# Patient Record
Sex: Female | Born: 2000
Health system: Southern US, Community
[De-identification: ages and names within clinical notes are randomized; demographics above are authoritative.]

## PROBLEM LIST (undated history)

## (undated) DIAGNOSIS — T7840XA Allergy, unspecified, initial encounter: Secondary | ICD-10-CM

## (undated) HISTORY — DX: Allergy, unspecified, initial encounter: T78.40XA

## (undated) HISTORY — PX: INGUINAL HERNIA REPAIR: SUR1180

## (undated) HISTORY — PX: HAND SURGERY: SHX662

---
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
## 2018-02-04 ENCOUNTER — Ambulatory Visit (HOSPITAL_COMMUNITY)
Admission: EM | Admit: 2018-02-04 | Discharge: 2018-02-04 | Disposition: A | Discharge status: Home / Self Care | Payer: 59 | Attending: Family Medicine | Admitting: Family Medicine

## 2018-02-04 ENCOUNTER — Ambulatory Visit (INDEPENDENT_AMBULATORY_CARE_PROVIDER_SITE_OTHER): Payer: 59

## 2018-02-04 ENCOUNTER — Encounter (HOSPITAL_COMMUNITY): Payer: Self-pay

## 2018-02-04 DIAGNOSIS — J189 Pneumonia, unspecified organism: Secondary | ICD-10-CM

## 2018-02-04 DIAGNOSIS — J181 Lobar pneumonia, unspecified organism: Secondary | ICD-10-CM | POA: Diagnosis not present

## 2018-02-04 MED ORDER — IBUPROFEN 600 MG PO TABS: 600.0000 mg | ORAL_TABLET | Freq: Four times a day (QID) | ORAL | 0 refills | Status: DC | PRN

## 2018-02-04 MED ORDER — AZITHROMYCIN 250 MG PO TABS: 250.0000 mg | ORAL_TABLET | Freq: Every day | ORAL | 0 refills | Status: AC

## 2018-02-04 MED ORDER — PSEUDOEPH-BROMPHEN-DM 30-2-10 MG/5ML PO SYRP: 5.0000 mL | ORAL_SOLUTION | Freq: Four times a day (QID) | ORAL | 0 refills | Status: DC | PRN

## 2018-02-04 MED ORDER — ACETAMINOPHEN 325 MG PO TABS
650.0000 mg | ORAL_TABLET | Freq: Once | ORAL | Status: AC
  Administered 2018-02-04: 650 mg via ORAL

## 2018-02-04 MED ORDER — ACETAMINOPHEN 325 MG PO TABS
ORAL_TABLET | ORAL | Status: AC
  Filled 2018-02-04: qty 2

## 2018-02-04 NOTE — Discharge Instructions (Addendum)
Pneumonia on Xray Please begin azithromycin- 2 tabs today, 1 tab for the following 4 days May use cough syrup provided as needed ever 6-8 hours Alternate ibuprofen and tylenol every 4 hours to help with fever  Follow up if fever not breaking, shortness of breath and chest discomfort not improving

## 2018-02-04 NOTE — ED Triage Notes (Signed)
Pt presents with deep non productive persistent cough, shortness of breath and fever.

## 2018-02-04 NOTE — ED Provider Notes (Signed)
[facility]    CSN: [id number] Arrival date & time: 02/04/18  1407     History   Chief Complaint Chief Complaint  Patient presents with  . Cough  . Fever  . Shortness of Breath    HPI Carla Wang is a 17 y.o. female history of eczema, presenting today for evaluation of cough and fever.  Patient has had cough and fever for the past week.  She was seen by her PCP on Friday and treated for influenza-like illness with symptomatic care.  Her cough and fever have persisted.  She went to her primary care earlier today and was recommended to have chest x-ray to rule out pneumonia.  She has had minimal sore throat and congestion/rhinorrhea.  She has had some shortness of breath and chest discomfort.  Cough is been nonproductive.  Has tried Alka-Seltzer cough and cold without relief.  Has also been prescribed Promethazine DM without relief.   HPI  History reviewed. No pertinent past medical history.  Patient Active Problem List   Diagnosis Date Noted  . Acne vulgaris 10/21/2017  . Other seborrheic dermatitis 10/21/2017  . Eczema 10/21/2017  . Chronic pain of right knee 03/14/2017  . Dry skin dermatitis 03/14/2017  . Dysmenorrhea 10/20/2015  . Scoliosis 09/08/2014    Past Surgical History:  Procedure Laterality Date  . HAND SURGERY    . INGUINAL HERNIA REPAIR      OB History   None      Home Medications    Prior to Admission medications   Medication Sig Start Date End Date Taking? Authorizing Provider  azithromycin (ZITHROMAX) 250 MG tablet Take 1 tablet (250 mg total) by mouth daily for 5 days. Take first 2 tablets together, then 1 every day until finished. 02/04/18 02/09/18  Matisha Termine C, PA-C  brompheniramine-pseudoephedrine-DM 30-2-10 MG/5ML syrup Take 5 mLs by mouth 4 (four) times daily as needed. 02/04/18   Kayle Passarelli C, PA-C  ibuprofen (ADVIL,MOTRIN) 600 MG tablet Take 1 tablet (600 mg total) by mouth every 6 (six) hours as needed. 02/04/18    Nishawn Rotan C, PA-C  norethindrone-ethinyl estradiol-iron (ESTROSTEP FE,TILIA FE,TRI-LEGEST FE) 1-20/1-30/1-35 MG-MCG tablet Take 1 tablet by mouth daily.    [provider]  tacrolimus (PROTOPIC) 0.03 % ointment Apply topically 2 (two) times daily. 03/14/17   Briscoe Deutscher, DO    Family History History reviewed. No pertinent family history.  Social History Social History   Tobacco Use  . Smoking status: Never Smoker  . Smokeless tobacco: Never Used  Substance Use Topics  . Alcohol use: No  . Drug use: No     Allergies   Patient has no known allergies.   Review of Systems Review of Systems  Constitutional: Positive for appetite change and fever. Negative for activity change, chills and fatigue.  HENT: Negative for congestion, ear pain, rhinorrhea, sinus pressure, sore throat and trouble swallowing.   Eyes: Negative for discharge and redness.  Respiratory: Positive for cough and shortness of breath. Negative for chest tightness.   Cardiovascular: Negative for chest pain.  Gastrointestinal: Negative for abdominal pain, diarrhea, nausea and vomiting.  Musculoskeletal: Negative for myalgias.  Skin: Negative for rash.  Neurological: Negative for dizziness, light-headedness and headaches.     Physical Exam Triage Vital Signs ED Triage Vitals  Enc Vitals Group     BP 02/04/18 1504 120/80     Pulse Rate 02/04/18 1504 (!) 125     Resp 02/04/18 1504 20  Temp 02/04/18 1504 (!) 102.2 F (39 C)     Temp Source 02/04/18 1504 Oral     SpO2 02/04/18 1504 100 %     Weight --      Height --      Head Circumference --      Peak Flow --      Pain Score 02/04/18 1505 6     Pain Loc --      Pain Edu? --      Excl. in [place]? --    No data found.  Updated Vital Signs BP 120/80 (BP Location: Right Arm)   Pulse (!) 125   Temp (!) 102.2 F (39 C) (Oral)   Resp 20   LMP 01/16/2018   SpO2 100%   Visual Acuity Right Eye Distance:   Left Eye Distance:   Bilateral  Distance:    Right Eye Near:   Left Eye Near:    Bilateral Near:     Physical Exam  Constitutional: She appears well-developed and well-nourished. No distress.  HENT:  Head: Normocephalic and atraumatic.  Bilateral ears without tenderness to palpation of external auricle, tragus and mastoid, EAC's without erythema or swelling, TM's with good bony landmarks and cone of light. Non erythematous.  Oral mucosa pink and moist, no tonsillar enlargement or exudate. Posterior pharynx patent and nonerythematous, no uvula deviation or swelling. Normal phonation.  Eyes: Conjunctivae are normal.  Neck: Neck supple.  Cardiovascular: Normal rate and regular rhythm.  No murmur heard. Pulmonary/Chest: Effort normal. No respiratory distress.  Breathing comfortably at rest, slight decreased breath sounds on the left side of chest  Abdominal: Soft. There is no tenderness.  Musculoskeletal: She exhibits no edema.  Neurological: She is alert.  Skin: Skin is warm and dry.  Psychiatric: She has a normal mood and affect.  Nursing note and vitals reviewed.    UC Treatments / Results  Labs (all labs ordered are listed, but only abnormal results are displayed) Labs Reviewed - No data to display  EKG None  Radiology Dg Chest 2 View  Result Date: 02/04/2018 CLINICAL DATA:  Shortness of breath.  Fever. EXAM: CHEST - 2 VIEW COMPARISON:  August 29, 2014 FINDINGS: There is an infiltrate in the superior segment of the left lower lobe. The heart, hila, mediastinum, and remainder of the lungs are normal. No pneumothorax. IMPRESSION: Infiltrate, most consistent with pneumonia, in the superior segment of the left lower lobe. Recommend follow-up to resolution. Electronically Signed   By: Dorise Bullion III M.D   On: 02/04/2018 15:59    Procedures Procedures (including critical care time)  Medications Ordered in UC Medications  acetaminophen (TYLENOL) tablet 650 mg (650 mg Oral Given 02/04/18 1511)    Initial  Impression / Assessment and Plan / UC Course  I have reviewed the triage vital signs and the nursing notes.  Pertinent labs & imaging results that were available during my care of the patient were reviewed by me and considered in my medical decision making (see chart for details).     Left lower lobe pneumonia, will treat with azithromycin.  Cough syrup as needed.  Tylenol and ibuprofen to control fever.  Push fluids.  May try honey as well.Discussed strict return precautions. Patient verbalized understanding and is agreeable with plan.  Final Clinical Impressions(s) / UC Diagnoses   Final diagnoses:  Community acquired pneumonia of left lower lobe of lung [facility])     Discharge Instructions     Pneumonia on Xray Please  begin azithromycin- 2 tabs today, 1 tab for the following 4 days May use cough syrup provided as needed ever 6-8 hours Alternate ibuprofen and tylenol every 4 hours to help with fever  Follow up if fever not breaking, shortness of breath and chest discomfort not improving   ED Prescriptions    Medication Sig Dispense Auth. Provider   azithromycin (ZITHROMAX) 250 MG tablet Take 1 tablet (250 mg total) by mouth daily for 5 days. Take first 2 tablets together, then 1 every day until finished. 6 tablet Quadarius Henton C, PA-C   brompheniramine-pseudoephedrine-DM 30-2-10 MG/5ML syrup Take 5 mLs by mouth 4 (four) times daily as needed. 120 mL Gayla Benn C, PA-C   ibuprofen (ADVIL,MOTRIN) 600 MG tablet Take 1 tablet (600 mg total) by mouth every 6 (six) hours as needed. 30 tablet Dannell Gortney, [place] C, PA-C     Controlled Substance Prescriptions [place] Controlled Substance Registry consulted? Not Applicable   Janith Lima, [place] 02/04/18 1627

## 2018-12-01 ENCOUNTER — Encounter: Payer: Self-pay | Admitting: Family Medicine

## 2018-12-01 ENCOUNTER — Other Ambulatory Visit: Payer: Self-pay

## 2018-12-01 ENCOUNTER — Ambulatory Visit (INDEPENDENT_AMBULATORY_CARE_PROVIDER_SITE_OTHER): Payer: 59 | Admitting: Family Medicine

## 2018-12-01 VITALS — BP 110/80 | HR 113 | Temp 97.7°F | Ht 65.0 in | Wt 140.0 lb

## 2018-12-01 DIAGNOSIS — J302 Other seasonal allergic rhinitis: Secondary | ICD-10-CM | POA: Diagnosis not present

## 2018-12-01 DIAGNOSIS — H6981 Other specified disorders of Eustachian tube, right ear: Secondary | ICD-10-CM

## 2018-12-01 DIAGNOSIS — H6991 Unspecified Eustachian tube disorder, right ear: Secondary | ICD-10-CM

## 2018-12-01 DIAGNOSIS — Z23 Encounter for immunization: Secondary | ICD-10-CM

## 2018-12-01 DIAGNOSIS — M7050 Other bursitis of knee, unspecified knee: Secondary | ICD-10-CM

## 2018-12-01 DIAGNOSIS — L853 Xerosis cutis: Secondary | ICD-10-CM

## 2018-12-01 MED ORDER — FLUTICASONE PROPIONATE 50 MCG/ACT NA SUSP: 2.0000 | Freq: Every day | NASAL | 6 refills | Status: DC

## 2018-12-01 MED ORDER — MONTELUKAST SODIUM 10 MG PO TABS: 10.0000 mg | ORAL_TABLET | Freq: Every day | ORAL | 3 refills | Status: DC

## 2018-12-01 NOTE — Progress Notes (Signed)
   Carla Wang is a 18 y.o. female here for an acute visit.  History of Present Illness:   HPI: Right knee pain.  Gradual onset.  Started about a year ago.  Intermittent.  No trauma.  Pain is medial knee, worse with flexion.  No redness or swelling.  Uses anti-inflammatories without much relief.  Right ear pain.  Ongoing intermittently.  Pain around jawline sometimes.  History of seasonal allergies.  Taking antihistamines over-the-counter only.  PMHx, SurgHx, SocialHx, Medications, and Allergies were reviewed in the Visit Navigator and updated as appropriate.  Current Medications   .  norethindrone-ethinyl estradiol-iron (ESTROSTEP FE,TILIA FE,TRI-LEGEST FE) 1-20/1-30/1-35 MG-MCG tablet, Take 1 tablet by mouth daily., Disp: , Rfl:   No Known Allergies   Review of Systems   Pertinent items are noted in the HPI. Otherwise, ROS is negative.  Vitals   Vitals:   12/01/18 0912  BP: 110/80  Pulse: (!) 113  Temp: 97.7 F (36.5 C)  TempSrc: Temporal  SpO2: 96%  Weight: 140 lb (63.5 kg)  Height: 5\' 5"  (1.651 m)     Body mass index is 23.3 kg/m.  Physical Exam   Physical Exam Vitals signs and nursing note reviewed.  HENT:     Head: Normocephalic and atraumatic.     Right Ear: Tympanic membrane and external ear normal.     Left Ear: Tympanic membrane and external ear normal.     Nose: No congestion or rhinorrhea.  Eyes:     Pupils: Pupils are equal, round, and reactive to light.  Neck:     Musculoskeletal: Normal range of motion and neck supple.  Cardiovascular:     Rate and Rhythm: Normal rate and regular rhythm.     Heart sounds: Normal heart sounds.  Pulmonary:     Effort: Pulmonary effort is normal.  Abdominal:     Palpations: Abdomen is soft.  Musculoskeletal:     Right knee: She exhibits decreased range of motion.       Legs:  Skin:    General: Skin is warm.  Neurological:     Mental Status: She is alert.  Psychiatric:        Behavior: Behavior  normal.    Assessment and Plan   [place] was seen today for otalgia.  Diagnoses and all orders for this visit:  Pes anserine bursitis, right knee -     AMB referral to sports medicine  Dry skin dermatitis  Dysfunction of right eustachian tube -     fluticasone (FLONASE) 50 MCG/ACT nasal spray; Place 2 sprays into both nostrils daily.  Seasonal allergies -     montelukast (SINGULAIR) 10 MG tablet; Take 1 tablet (10 mg total) by mouth at bedtime. -     fluticasone (FLONASE) 50 MCG/ACT nasal spray; Place 2 sprays into both nostrils daily.  Need for immunization against influenza -     Flu Vaccine QUAD 36+ mos IM   . Reviewed expectations re: course of current medical issues. . Discussed self-management of symptoms. . Outlined signs and symptoms indicating need for more acute intervention. . Patient verbalized understanding and all questions were answered. Marland Kitchen Health Maintenance issues including appropriate healthy diet, exercise, and smoking avoidance were discussed with patient. . See orders for this visit as documented in the electronic medical record. . Patient received an After Visit Summary.  Briscoe Deutscher, DO [place], [facility] 12/01/2018

## 2018-12-01 NOTE — Patient Instructions (Signed)
Eustachian Tube Dysfunction  Eustachian tube dysfunction refers to a condition in which a blockage develops in the narrow passage that connects the middle ear to the back of the nose (eustachian tube). The eustachian tube regulates air pressure in the middle ear by letting air move between the ear and nose. It also helps to drain fluid from the middle ear space. Eustachian tube dysfunction can affect one or both ears. When the eustachian tube does not function properly, air pressure, fluid, or both can build up in the middle ear. What are the causes? This condition occurs when the eustachian tube becomes blocked or cannot open normally. Common causes of this condition include:  Ear infections.  Colds and other infections that affect the nose, mouth, and throat (upper respiratory tract).  Allergies.  Irritation from cigarette smoke.  Irritation from stomach acid coming up into the esophagus (gastroesophageal reflux). The esophagus is the tube that carries food from the mouth to the stomach.  Sudden changes in air pressure, such as from descending in an airplane or scuba diving.  Abnormal growths in the nose or throat, such as: ? Growths that line the nose (nasal polyps). ? Abnormal growth of cells (tumors). ? Enlarged tissue at the back of the throat (adenoids). What increases the risk? You are more likely to develop this condition if:  You smoke.  You are overweight.  You are a child who has: ? Certain birth defects of the mouth, such as cleft palate. ? Large tonsils or adenoids. What are the signs or symptoms? Common symptoms of this condition include:  A feeling of fullness in the ear.  Ear pain.  Clicking or popping noises in the ear.  Ringing in the ear.  Hearing loss.  Loss of balance.  Dizziness. Symptoms may get worse when the air pressure around you changes, such as when you travel to an area of high elevation, fly on an airplane, or go scuba diving. How is  this diagnosed? This condition may be diagnosed based on:  Your symptoms.  A physical exam of your ears, nose, and throat.  Tests, such as those that measure: ? The movement of your eardrum (tympanogram). ? Your hearing (audiometry). How is this treated? Treatment depends on the cause and severity of your condition.  In mild cases, you may relieve your symptoms by moving air into your ears. This is called "popping the ears."  In more severe cases, or if you have symptoms of fluid in your ears, treatment may include: ? Medicines to relieve congestion (decongestants). ? Medicines that treat allergies (antihistamines). ? Nasal sprays or ear drops that contain medicines that reduce swelling (steroids). ? A procedure to drain the fluid in your eardrum (myringotomy). In this procedure, a small tube is placed in the eardrum to:  Drain the fluid.  Restore the air in the middle ear space. ? A procedure to insert a balloon device through the nose to inflate the opening of the eustachian tube (balloon dilation). Follow these instructions at home: Lifestyle  Do not do any of the following until your health care provider approves: ? Travel to high altitudes. ? Fly in airplanes. ? Work in a pressurized cabin or room. ? Scuba dive.  Do not use any products that contain nicotine or tobacco, such as cigarettes and e-cigarettes. If you need help quitting, ask your health care provider.  Keep your ears dry. Wear fitted earplugs during showering and bathing. Dry your ears completely after. General instructions  Take over-the-counter   and prescription medicines only as told by your health care provider.  Use techniques to help pop your ears as recommended by your health care provider. These may include: ? Chewing gum. ? Yawning. ? Frequent, forceful swallowing. ? Closing your mouth, holding your nose closed, and gently blowing as if you are trying to blow air out of your nose.  Keep all  follow-up visits as told by your health care provider. This is important. Contact a health care provider if:  Your symptoms do not go away after treatment.  Your symptoms come back after treatment.  You are unable to pop your ears.  You have: ? A fever. ? Pain in your ear. ? Pain in your head or neck. ? Fluid draining from your ear.  Your hearing suddenly changes.  You become very dizzy.  You lose your balance. Summary  Eustachian tube dysfunction refers to a condition in which a blockage develops in the eustachian tube.  It can be caused by ear infections, allergies, inhaled irritants, or abnormal growths in the nose or throat.  Symptoms include ear pain, hearing loss, or ringing in the ears.  Mild cases are treated with maneuvers to unblock the ears, such as yawning or ear popping.  Severe cases are treated with medicines. Surgery may also be done (rare). This information is not intended to replace advice given to you by your health care provider. Make sure you discuss any questions you have with your health care provider. Document Released: 03/17/2015 Document Revised: 06/10/2017 Document Reviewed: 06/10/2017 Elsevier Patient Education  [employer].  Pes Anserine Bursitis Rehab Ask your health care provider which exercises are safe for you. Do exercises exactly as told by your health care provider and adjust them as directed. It is normal to feel mild stretching, pulling, tightness, or discomfort as you do these exercises. Stop right away if you feel sudden pain or your pain gets worse. Do not begin these exercises until told by your health care provider. Stretching and range-of-motion exercises These exercises warm up your muscles and joints and improve the movement and flexibility of your knee. These exercises also help to relieve pain and stiffness. Hamstring stretch, doorway  1. Lie on your back in front of a doorway with your left / right leg resting against  the wall and your other leg flat on the floor in the doorway. There should be a slight bend in your left / right knee. 2. Straighten your left / right knee. You should feel a stretch behind your knee or thigh (hamstring). If you do not, scoot your buttocks closer to the door. 3. Hold this position for __________ seconds. Repeat __________ times. Complete this exercise __________ times a day. Seated stretch This exercise is sometimes called hamstrings and adductors stretch. 1. Sit on the floor with your legs stretched wide. Keep your knees straight during this exercise. 2. Keeping your head and back in a straight line, bend at your waist to reach for your left foot (position A). You should feel a stretch in your right inner thigh (adductors). 3. Hold for __________ seconds. Then slowly return to the upright position. 4. Keeping your head and back in a straight line, bend at your waist to reach forward (position B). You should feel a stretch behind both of your thighs or knees (hamstrings). 5. Hold for __________ seconds. Then slowly return to the upright position. 6. Keeping your head and back in a straight line, bend at your waist to reach for  your right foot (position C). You should feel a stretch in your left inner thigh (adductors). 7. Hold for __________ seconds. Then slowly return to the upright position. Repeat __________ times. Complete this exercise __________ times a day. Quadriceps, prone  1. Lie on your abdomen on a firm surface, such as a bed or padded floor (prone position). 2. Bend your left / right knee and hold your ankle. If you cannot reach your ankle or pant leg, loop a belt around your foot and grab the belt instead. 3. Gently pull your heel toward your buttocks. Your knee should not slide out to the side. You should feel a stretch in the front of your thigh and knee (quadriceps). 4. Hold this position for __________ seconds. Repeat __________ times. Complete this exercise  __________ times a day. Strengthening exercises These exercises build strength and endurance in your knee and hip. Endurance is the ability to use your muscles for a long time, even after they get tired. Quadriceps terminal knee extension 1. Secure a long loop of rubber exercise band around a sturdy object like a table leg. 2. Put the band behind your left / right knee. Step back from where the band is secured to put tension on the band. 3. Slowly bend your left / right knee. Keep your left / right foot flat on the floor. 4. Tighten the muscle in the front of your thigh (quadriceps) and push back against the band to straighten your knee until it is completely straight (terminal extension). 5. Hold this position for __________ seconds. 6. Return to having your left / right knee bent. Repeat __________ times. Complete this exercise __________ times a day. Straight leg raises, side-lying This exercise strengthens the muscles that rotate the leg at the hip and move it away from your body (hip abductors). 1. Lie on your side with your left / right leg in the top position. Lie so your head, shoulder, hip, and knee line up. You may bend your bottom knee to help you balance. 2. Lift your top leg 4-6 inches (10-15 cm) while keeping your toes pointed straight ahead. 3. Hold this position for __________ seconds. 4. Slowly lower your leg to the starting position. 5. Allow your muscles to relax completely after each repetition. Repeat __________ times. Complete this exercise __________ times a day. This information is not intended to replace advice given to you by your health care provider. Make sure you discuss any questions you have with your health care provider. Document Released: 02/18/2005 Document Revised: 06/11/2018 Document Reviewed: 12/11/2017 Elsevier Patient Education  2020 [employer].

## 2018-12-07 ENCOUNTER — Ambulatory Visit (INDEPENDENT_AMBULATORY_CARE_PROVIDER_SITE_OTHER): Payer: 59 | Admitting: Family Medicine

## 2018-12-07 ENCOUNTER — Encounter: Payer: Self-pay | Admitting: Family Medicine

## 2018-12-07 DIAGNOSIS — M6751 Plica syndrome, right knee: Secondary | ICD-10-CM | POA: Diagnosis not present

## 2018-12-07 NOTE — Progress Notes (Signed)
I saw and examined the patient with Dr. Mayer Masker and agree with assessment and plan as outlined.    Chronic medial right knee pain for about a year.  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  She hyperpronates, possibly contributing to her pain.  Also has some quad weakness.  Discussed options and she would like to try an injection today, followed by home strengthening exercises.  Suggested shoes with good arch supports as well.  Could refer her to PT if pain persists.

## 2018-12-07 NOTE — Progress Notes (Signed)
Carla Wang - 18 y.o. female MRN [id number]  Date of birth: Oct 24, 2000  Office Visit Note: Visit Date: 12/07/2018 PCP: Briscoe Deutscher, DO Referred by: Briscoe Deutscher, DO  Subjective: Chief Complaint  Patient presents with  . Right Knee - Pain    Pain off & on medial aspect of knee. Referred by Dr. Parke Simmers for pes bursitis.   HPI: Carla Wang is a 18 y.o. female who comes in today with intermittent right medial knee pain for the past year. She reports that pain is worse with prolonged walking. Knee swells intermittently, improves with icing and iburprofen. Denies inciting injury. Her right knee also bothered her 3 years ago when she was taking weight lifting class in high school.  Denies symptoms of catching, locking, or giving out.   ROS Otherwise per HPI.  Assessment & Plan: Visit Diagnoses:  1. Plica syndrome of knee, right    Pain with palpation of infrapatellar medial plica, no effusion on ultrasound.  Plan:  - corticosteroid injection of medial infrapatellar plica - demonstrated isometric quadriceps strengthening exercises to do daily  - recommended shoes with insole support   Meds & Orders: No orders of the defined types were placed in this encounter.  No orders of the defined types were placed in this encounter.   Follow-up: PRN  Procedures: Right knee corticosteroid injection: Medial right knee infrapatellar plica was palpated, Overlying skin was marked, prepped in a sterile fashion with iodine and alcohol swabs. Ethyl chloride was used as topical analgesic spray and plica was injected with 4 cc lidocaine and 40 mg methylpred.   Clinical History: No specialty comments available.   She reports that she has never smoked. She has never used smokeless tobacco. No results for input(s): HGBA1C, LABURIC in the last 8760 hours.  Objective:  VS:  HT:    WT:   BMI:     BP:   HR: bpm  TEMP: ( )  RESP:  Physical Exam  PHYSICAL EXAM: Gen: NAD, alert,  cooperative with exam, well-appearing HEENT: clear conjunctiva,  CV:  no edema, capillary refill brisk, normal rate Resp: non-labored Skin: no rashes, normal turgor  Neuro: no gross deficits.  Psych:  alert and oriented  Ortho Exam  Right Knee: - Inspection: no gross deformity. No swelling/effusion, erythema or bruising. Skin intact - Palpation: TTP along medial knee, palpable cord medial to patella and infarpatellar, TTP over infrapatellar medial fat pad   - ROM: full active ROM with flexion and extension in knee and hip - Strength: 5/5 strength - Neuro/vasc: NV intact - Special Tests: - LIGAMENTS: negative anterior and posterior drawer, negative Lachman's, no MCL or LCL laxity  -- MENISCUS: negative McMurray's, negative Thessaly  -- PF JOINT: nml patellar mobility bilaterally.  negative patellar grind  Hips: normal ROM, negative FABER and FADIR bilaterally  Feet: pes planus bilaterally, ankle pronation with standing   Imaging: No results found.  Past Medical/Family/Surgical/Social History: Medications & Allergies reviewed per EMR, new medications updated. Patient Active Problem List   Diagnosis Date Noted  . Acne vulgaris 10/21/2017  . Other seborrheic dermatitis 10/21/2017  . Eczema 10/21/2017  . Chronic pain of right knee 03/14/2017  . Dry skin dermatitis 03/14/2017  . Dysmenorrhea 10/20/2015  . Scoliosis 09/08/2014   History reviewed. No pertinent past medical history. History reviewed. No pertinent family history. Past Surgical History:  Procedure Laterality Date  . HAND SURGERY    . INGUINAL HERNIA REPAIR     Social History  Occupational History  . Occupation: Ship broker  Tobacco Use  . Smoking status: Never Smoker  . Smokeless tobacco: Never Used  Substance and Sexual Activity  . Alcohol use: No  . Drug use: No  . Sexual activity: Not on file

## 2019-03-15 ENCOUNTER — Other Ambulatory Visit: Payer: 59

## 2019-04-20 ENCOUNTER — Ambulatory Visit (INDEPENDENT_AMBULATORY_CARE_PROVIDER_SITE_OTHER): Payer: 59 | Admitting: Family Medicine

## 2019-04-20 ENCOUNTER — Ambulatory Visit: Payer: Self-pay | Admitting: Family Medicine

## 2019-04-20 ENCOUNTER — Other Ambulatory Visit: Payer: Self-pay

## 2019-04-20 ENCOUNTER — Encounter: Payer: Self-pay | Admitting: Family Medicine

## 2019-04-20 VITALS — Ht 65.0 in | Wt 140.0 lb

## 2019-04-20 DIAGNOSIS — J309 Allergic rhinitis, unspecified: Secondary | ICD-10-CM

## 2019-04-20 MED ORDER — FEXOFENADINE HCL 180 MG PO TABS: 180.0000 mg | ORAL_TABLET | Freq: Every day | ORAL | Status: DC

## 2019-04-20 NOTE — Progress Notes (Signed)
Virtual Visit via Video Note  Subjective  CC:  Chief Complaint  Patient presents with  . Ear Pain    started last week. Right ear pain  . throat irritation    itching of the throat. no fever     I connected with [place] on 04/20/19 at  4:20 PM EST by a video enabled telemedicine application and verified that I am speaking with the correct person using two identifiers. Location patient: Home Location provider: [facility] [place], Office Persons participating in the virtual visit: [place], Leamon Arnt, MD Serita Sheller, [place]  Same day acute visit; PCP not available. New pt to me. Chart reviewed.   I discussed the limitations of evaluation and management by telemedicine and the availability of in person appointments. The patient expressed understanding and agreed to proceed. HPI: Carla Wang is a 19 y.o. female who was contacted today to address the problems listed above in the chief complaint. . 20 yo with eczema, allergies c/o worsening allergy sxs. [facility] (I reviewed [facility]) for ear fullness and treated with benadryl. Uses flonase and singulair but complains of pnd,itchy throat and ears. No sinus pain,f/c/s, cough, malaise. Uses allegra on occasion (only when it is really bad). Has never seen an allergist. Has been treated for secondary otitis media in past.   Assessment  1. Chronic allergic rhinitis      Plan   AR:  Start allegra daily. Monitor for improvement over next few weeks. May add sudafed prn if needed. If not improving, would consider stepping up nasal steroid and /or allergy referral.   rec pt make a toc appt since prior pcp is no longer here.  I discussed the assessment and treatment plan with the patient. The patient was provided an opportunity to ask questions and all were answered. The patient agreed with the plan and demonstrated an understanding of the instructions.   The patient was advised  to call back or seek an in-person evaluation if the symptoms worsen or if the condition fails to improve as anticipated. Follow up: toc appt  Visit date not found  Meds ordered this encounter  Medications  . fexofenadine (ALLEGRA ALLERGY) 180 MG tablet    Sig: Take 1 tablet (180 mg total) by mouth daily.    Dispense:         I reviewed the patients updated PMH, FH, and SocHx.    Patient Active Problem List   Diagnosis Date Noted  . Acne vulgaris 10/21/2017  . Other seborrheic dermatitis 10/21/2017  . Eczema 10/21/2017  . Chronic pain of right knee 03/14/2017  . Dry skin dermatitis 03/14/2017  . Dysmenorrhea 10/20/2015  . Scoliosis 09/08/2014   Current Meds  Medication Sig  . fluticasone (FLONASE) 50 MCG/ACT nasal spray Place 2 sprays into both nostrils daily.  Marland Kitchen ibuprofen (ADVIL,MOTRIN) 600 MG tablet Take 1 tablet (600 mg total) by mouth every 6 (six) hours as needed.  . montelukast (SINGULAIR) 10 MG tablet Take 1 tablet (10 mg total) by mouth at bedtime.  . norethindrone-ethinyl estradiol-iron (ESTROSTEP FE,TILIA FE,TRI-LEGEST FE) 1-20/1-30/1-35 MG-MCG tablet Take 1 tablet by mouth daily.    Allergies: Patient has No Known Allergies. Family History: Patient family history is not on file. Social History:  Patient  reports that she has never smoked. She has never used smokeless tobacco. She reports that she does not drink alcohol or use drugs.  Review of  Systems: Constitutional: Negative for fever malaise or anorexia Cardiovascular: negative for chest pain Respiratory: negative for SOB or persistent cough Gastrointestinal: negative for abdominal pain  OBJECTIVE Vitals: Ht 5\' 5"  (1.651 m)   Wt 140 lb (63.5 kg)   LMP 04/08/2019   BMI 23.30 kg/m  General: no acute distress , A&Ox3 No nasal congestion  Leamon Arnt, MD

## 2019-08-09 ENCOUNTER — Encounter: Payer: Self-pay | Admitting: Family Medicine

## 2019-11-29 ENCOUNTER — Other Ambulatory Visit: Payer: Self-pay

## 2019-11-29 ENCOUNTER — Encounter: Payer: Self-pay | Admitting: Family Medicine

## 2019-11-29 ENCOUNTER — Ambulatory Visit (INDEPENDENT_AMBULATORY_CARE_PROVIDER_SITE_OTHER): Payer: 59 | Admitting: Family Medicine

## 2019-11-29 VITALS — BP 120/82 | HR 72 | Temp 98.0°F | Resp 16 | Ht 65.0 in | Wt 157.0 lb

## 2019-11-29 DIAGNOSIS — Z3041 Encounter for surveillance of contraceptive pills: Secondary | ICD-10-CM | POA: Insufficient documentation

## 2019-11-29 DIAGNOSIS — G8929 Other chronic pain: Secondary | ICD-10-CM

## 2019-11-29 DIAGNOSIS — Z23 Encounter for immunization: Secondary | ICD-10-CM | POA: Diagnosis not present

## 2019-11-29 DIAGNOSIS — K5900 Constipation, unspecified: Secondary | ICD-10-CM

## 2019-11-29 DIAGNOSIS — J309 Allergic rhinitis, unspecified: Secondary | ICD-10-CM | POA: Diagnosis not present

## 2019-11-29 DIAGNOSIS — M41129 Adolescent idiopathic scoliosis, site unspecified: Secondary | ICD-10-CM

## 2019-11-29 DIAGNOSIS — H6981 Other specified disorders of Eustachian tube, right ear: Secondary | ICD-10-CM | POA: Diagnosis not present

## 2019-11-29 DIAGNOSIS — H9201 Otalgia, right ear: Secondary | ICD-10-CM | POA: Insufficient documentation

## 2019-11-29 DIAGNOSIS — N946 Dysmenorrhea, unspecified: Secondary | ICD-10-CM

## 2019-11-29 MED ORDER — FLUTICASONE PROPIONATE 50 MCG/ACT NA SUSP: 2.0000 | Freq: Every day | NASAL | 6 refills | Status: DC

## 2019-11-29 NOTE — Progress Notes (Signed)
Subjective  CC:  Chief Complaint  Patient presents with  . Transitions Of Care  . Ear Pain    right ear   . Constipation    BM twice weekly    HPI: Carla Wang is a 19 y.o. female who presents to [facility] today to establish care with me as a new patient.   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  Lives with her parents and her maternal grandmother.  Is currently in a relationship.  Has been sexually active.  Recently saw GYN for female wellness exam.  Reports normal Pap smear done at the time.  Reports negative HIV test.  Overall she is happy and healthy.  She is a non-smoker.  Drinks occasionally.  No drug use.  Chronic right ear pain and history of recurrent otitis media: She has been to several different urgent cares over the last several years due to this problem.  Diagnosed with eustachian tube dysfunction, allergies, eczema and infections.  However she has almost constant right ear pain.  Feels full or like it needs to drain.  Occasionally she will have sharp pain.  Sometimes she will have itching in the ear canal.  She currently complains of pain.  She is on Advertising account planner.  Denies drainage, trauma, hearing loss.  Chronic allergies manages with over-the-counter medicine and Flonase.  History of dysmenorrhea as an adolescent now well managed on oral contraceptives.  Regular cycles.  She has been sexually active.  She reports negative STD testing.  Complains of not using the bathroom regularly.  She tends to move her bowels once or twice weekly.  She denies hard or hard to pass stools.  Denies abdominal pain.  Denies melena.  Occasionally she will feel some bloating symptoms.  No upper GI symptoms.  She admits her diet is typical of a low mood.  High in processed foods high in fatty foods and high in fast foods.  She does not get a lot of fiber, water or vegetables.  She denies  alternating bouts of diarrhea.  She denies pain.  Reviewed records from orthopedics.  She has seen orthopedics in 2016.  She has mild scoliosis.  She currently does not have back pain.  Assessment  1. Chronic right ear pain   2. Chronic allergic rhinitis   3. Oral contraceptive use   4. Dysfunction of right eustachian tube   5. Constipation, unspecified constipation type   6. Need for immunization against influenza   7. Adolescent idiopathic scoliosis, unspecified spinal region   8. Dysmenorrhea      Plan   Chronic right ear pain with history of reported infections: Has failed conservative management.  Refer to ENT for further evaluation.  Patient agrees.  Chronic allergic rhinitis: For now continue Allegra and Flonase.  Continue oral contraceptives.  Recommend condom use when sexually active.  Recommend annual STD testing.  Will request records from GYN.  Constipation: Mild.  Recommend improving diet, fluid intake and consideration of MiraLAX if she has symptoms.  If she moves her bowels twice weekly without pain, bloating or hard to pass stools, no treatment is needed.  Patient understands and agrees with care plan.  Mild scoliosis without symptoms at this time.  Flu shot updated today.  Tdap updated today.  Follow up: 12 months for your complete annual physical exam with blood work. Please come fasting. Orders Placed This Encounter  Procedures  . Flu Vaccine QUAD 36+ mos IM  . Tdap vaccine greater than or equal to 7yo IM   Meds ordered this encounter  Medications  . fluticasone (FLONASE) 50 MCG/ACT nasal spray    Sig: Place 2 sprays into both nostrils daily.    Dispense:  16 g    Refill:  6     Depression screen [facility] 2/9 12/01/2018 02/21/2017 01/08/2016 05/15/2015  Decreased Interest 1 0 0 0  Down, Depressed, Hopeless 0 0 0 0  PHQ - 2 Score 1 0 0 0  Altered sleeping 0 - - -  Tired, decreased energy 1 - - -  Change in appetite 0 - - -  Feeling bad or failure about  yourself  1 - - -  Trouble concentrating 0 - - -  Moving slowly or fidgety/restless 0 - - -  Suicidal thoughts 0 - - -  PHQ-9 Score 3 - - -  Difficult doing work/chores Not difficult at all - - -    We updated and reviewed the patient's past history in detail and it is documented below.  Patient Active Problem List   Diagnosis Date Noted  . Chronic allergic rhinitis 11/29/2019  . Chronic right ear pain 11/29/2019  . Oral contraceptive use 11/29/2019  . Acne vulgaris 10/21/2017    Seen by [facility] assessment: acne, moderate    . Seborrheic dermatitis 10/21/2017    Followed by [facility]    . Eczema 10/21/2017  . Dysmenorrhea 10/20/2015    Started oral contraceptives for management at age 46.  This medical condition is well controlled.    . Scoliosis 09/08/2014    Mild, evaluated by pediatric orthopedics in 2016.  Low risk of progression.  Surveillance only recommended.    Health Maintenance  Topic Date Due  . PAP SMEAR-Modifier  11/29/2019 (Originally 06/03/2019)  . Hepatitis C Screening  11/28/2020 (Originally 2001/02/02)  . PAP-Cervical Cytology Screening  07/03/2022  . TETANUS/TDAP  11/28/2029  . INFLUENZA VACCINE  Completed  . HIV Screening  Completed   Immunization History  Administered Date(s) Administered  . Influenza,inj,Quad PF,6+ Mos 12/01/2018, 11/29/2019  . Tdap 11/29/2019   Current Meds  Medication Sig  . fexofenadine (ALLEGRA ALLERGY) 180 MG tablet Take 1 tablet (180 mg total) by mouth daily.  . fluticasone (FLONASE) 50 MCG/ACT nasal spray Place 2 sprays into both nostrils daily.  Marland Kitchen ibuprofen (ADVIL,MOTRIN) 600 MG tablet Take 1 tablet (600 mg total) by mouth every 6 (six) hours as needed.  . montelukast (SINGULAIR) 10 MG tablet Take 1 tablet (10 mg total) by mouth at bedtime.  . norethindrone-ethinyl estradiol-iron (ESTROSTEP FE,TILIA FE,TRI-LEGEST FE) 1-20/1-30/1-35 MG-MCG tablet Take 1 tablet by mouth daily.  . [DISCONTINUED] fluticasone  (FLONASE) 50 MCG/ACT nasal spray Place 2 sprays into both nostrils daily.    Allergies: Patient has No Known Allergies. Past Medical History Patient  has a past medical history of Allergy. Past Surgical History Patient  has a past surgical history that includes Inguinal hernia repair and Hand surgery. Family History: Patient family history includes Diabetes in her maternal aunt, maternal grandmother, and mother; Healthy in her brother; Prostate cancer in her paternal grandfather. Social History:  Patient  reports that she has never smoked. She has never used smokeless tobacco. She reports current alcohol use. She reports that she does not use drugs.  Review of Systems: Constitutional: negative for fever or malaise Ophthalmic: negative for photophobia, double vision or loss of vision Cardiovascular: negative for chest  pain, dyspnea on exertion, or new LE swelling Respiratory: negative for SOB or persistent cough Gastrointestinal: negative for abdominal pain, change in bowel habits or melena Genitourinary: negative for dysuria or gross hematuria Musculoskeletal: negative for new gait disturbance or muscular weakness Integumentary: negative for new or persistent rashes Neurological: negative for TIA or stroke symptoms Psychiatric: negative for SI or delusions Allergic/Immunologic: negative for hives  Patient Care Team    Relationship Specialty Notifications Start End  Leamon Arnt, MD PCP - General Family Medicine  11/29/19   Delma Post, PA-C  Dermatology  10/21/17   Eunice Blase, MD Consulting Physician Sports Medicine  04/20/19   Delsa Bern, MD Consulting Physician Obstetrics and Gynecology  11/29/19     Objective  Vitals: BP 120/82   Pulse 72   Temp 98 F (36.7 C) (Temporal)   Resp 16   Ht 5\' 5"  (1.651 m)   Wt 157 lb (71.2 kg)   BMI 26.13 kg/m  General:  Well developed, well nourished, no acute distress  Psych:  Alert and oriented,normal mood and  affect HEENT:  Normocephalic, atraumatic, non-icteric sclera, supple neck without adenopathy, mass or thyromegaly, TMs appear normal Cardiovascular:  RRR without gallop, rub or murmur Respiratory:  Good breath sounds bilaterally, CTAB with normal respiratory effort Gastrointestinal: normal bowel sounds, soft, non-tender, no noted masses. No HSM MSK: no deformities, contusions. Joints are without erythema or swelling Skin:  Warm, no rashes or suspicious lesions noted Neurologic:    Mental status is normal.    Commons side effects, risks, benefits, and alternatives for medications and treatment plan prescribed today were discussed, and the patient expressed understanding of the given instructions. Patient is instructed to call or message via MyChart if he/she has any questions or concerns regarding our treatment plan. No barriers to understanding were identified. We discussed Red Flag symptoms and signs in detail. Patient expressed understanding regarding what to do in case of urgent or emergency type symptoms.   Medication list was reconciled, printed and provided to the patient in AVS. Patient instructions and summary information was reviewed with the patient as documented in the AVS. This note was prepared with assistance of Dragon voice recognition software. Occasional wrong-word or sound-a-like substitutions may have occurred due to the inherent limitations of voice recognition software  This visit occurred during the SARS-CoV-2 public health emergency.  Safety protocols were in place, including screening questions prior to the visit, additional usage of staff PPE, and extensive cleaning of exam room while observing appropriate contact time as indicated for disinfecting solutions.

## 2019-11-29 NOTE — Patient Instructions (Signed)
Please return in 12 months for your annual complete physical; please come fasting.  Today you were given your Tdap and flu vaccines.  I have referred you to ENT. They will call you with an appointment.  See below for information on constipation.  You may use miralax as needed.   It was a pleasure meeting you today! Thank you for choosing [place] to meet your healthcare needs! I truly look forward to working with you. If you have any questions or concerns, please send me a message via Mychart or call the office at [phone].   Constipation, Adult Constipation is when a person has fewer bowel movements in a week than normal, has difficulty having a bowel movement, or has stools that are dry, hard, or larger than normal. Constipation may be caused by an underlying condition. It may become worse with age if a person takes certain medicines and does not take in enough fluids. Follow these instructions at home: Eating and drinking   Eat foods that have a lot of fiber, such as fresh fruits and vegetables, whole grains, and beans.  Limit foods that are high in fat, low in fiber, or overly processed, such as french fries, hamburgers, cookies, candies, and soda.  Drink enough fluid to keep your urine clear or pale yellow. General instructions  Exercise regularly or as told by your health care provider.  Go to the restroom when you have the urge to go. Do not hold it in.  Take over-the-counter and prescription medicines only as told by your health care provider. These include any fiber supplements.  Practice pelvic floor retraining exercises, such as deep breathing while relaxing the lower abdomen and pelvic floor relaxation during bowel movements.  Watch your condition for any changes.  Keep all follow-up visits as told by your health care provider. This is important. Contact a health care provider if:  You have pain that gets worse.  You have a fever.  You do not have a bowel movement  after 4 days.  You vomit.  You are not hungry.  You lose weight.  You are bleeding from the anus.  You have thin, pencil-like stools. Get help right away if:  You have a fever and your symptoms suddenly get worse.  You leak stool or have blood in your stool.  Your abdomen is bloated.  You have severe pain in your abdomen.  You feel dizzy or you faint. This information is not intended to replace advice given to you by your health care provider. Make sure you discuss any questions you have with your health care provider. Document Revised: 01/31/2017 Document Reviewed: 08/09/2015 Elsevier Patient Education  [place].   High-Fiber Diet Fiber, also called dietary fiber, is a type of carbohydrate that is found in fruits, vegetables, whole grains, and beans. A high-fiber diet can have many health benefits. Your health care provider may recommend a high-fiber diet to help: Prevent constipation. Fiber can make your bowel movements more regular. Lower your cholesterol. Relieve the following conditions: Swelling of veins in the anus (hemorrhoids). Swelling and irritation (inflammation) of specific areas of the digestive tract (uncomplicated diverticulosis). A problem of the large intestine (colon) that sometimes causes pain and diarrhea (irritable bowel syndrome, IBS). Prevent overeating as part of a weight-loss plan. Prevent heart disease, type 2 diabetes, and certain cancers. What is my plan? The recommended daily fiber intake in grams (g) includes: 38 g for men age 55 or younger. 30 g for men over age  58. 25 g for women age 60 or younger. 21 g for women over age 75. You can get the recommended daily intake of dietary fiber by: Eating a variety of fruits, vegetables, grains, and beans. Taking a fiber supplement, if it is not possible to get enough fiber through your diet. What do I need to know about a high-fiber diet? It is better to get fiber through food sources  rather than from fiber supplements. There is not a lot of research about how effective supplements are. Always check the fiber content on the nutrition facts label of any prepackaged food. Look for foods that contain 5 g of fiber or more per serving. Talk with a diet and nutrition specialist (dietitian) if you have questions about specific foods that are recommended or not recommended for your medical condition, especially if those foods are not listed below. Gradually increase how much fiber you consume. If you increase your intake of dietary fiber too quickly, you may have bloating, cramping, or gas. Drink plenty of water. Water helps you to digest fiber. What are tips for following this plan? Eat a wide variety of high-fiber foods. Make sure that half of the grains that you eat each day are whole grains. Eat breads and cereals that are made with whole-grain flour instead of refined flour or white flour. Eat brown rice, bulgur wheat, or millet instead of white rice. Start the day with a breakfast that is high in fiber, such as a cereal that contains 5 g of fiber or more per serving. Use beans in place of meat in soups, salads, and pasta dishes. Eat high-fiber snacks, such as berries, raw vegetables, nuts, and popcorn. Choose whole fruits and vegetables instead of processed forms like juice or sauce. What foods can I eat?  Fruits Berries. Pears. Apples. Oranges. Avocado. Prunes and raisins. Dried figs. Vegetables Sweet potatoes. Spinach. Kale. Artichokes. Cabbage. Broccoli. Cauliflower. Green peas. Carrots. Squash. Grains Whole-grain breads. Multigrain cereal. Oats and oatmeal. Brown rice. Barley. Bulgur wheat. [place]. Quinoa. Bran muffins. Popcorn. Rye wafer crackers. Meats and other proteins Navy, kidney, and pinto beans. Soybeans. Split peas. Lentils. Nuts and seeds. Dairy Fiber-fortified yogurt. Beverages Fiber-fortified soy milk. Fiber-fortified orange juice. Other foods Fiber  bars. The items listed above may not be a complete list of recommended foods and beverages. Contact a dietitian for more options. What foods are not recommended? Fruits Fruit juice. Cooked, strained fruit. Vegetables Fried potatoes. Canned vegetables. Well-cooked vegetables. Grains White bread. Pasta made with refined flour. White rice. Meats and other proteins Fatty cuts of meat. Fried chicken or fried fish. Dairy Milk. Yogurt. Cream cheese. Sour cream. Fats and oils Butters. Beverages Soft drinks. Other foods Cakes and pastries. The items listed above may not be a complete list of foods and beverages to avoid. Contact a dietitian for more information. Summary Fiber is a type of carbohydrate. It is found in fruits, vegetables, whole grains, and beans. There are many health benefits of eating a high-fiber diet, such as preventing constipation, lowering blood cholesterol, helping with weight loss, and reducing your risk of heart disease, diabetes, and certain cancers. Gradually increase your intake of fiber. Increasing too fast can result in cramping, bloating, and gas. Drink plenty of water while you increase your fiber. The best sources of fiber include whole fruits and vegetables, whole grains, nuts, seeds, and beans. This information is not intended to replace advice given to you by your health care provider. Make sure you discuss any questions you have  with your health care provider. Document Revised: 12/23/2016 Document Reviewed: 12/23/2016 Elsevier Patient Education  2020 [employer].

## 2020-02-09 ENCOUNTER — Encounter: Payer: Self-pay | Admitting: Family Medicine

## 2020-02-10 ENCOUNTER — Other Ambulatory Visit: Payer: Self-pay

## 2020-02-10 DIAGNOSIS — G8929 Other chronic pain: Secondary | ICD-10-CM

## 2020-02-10 NOTE — Progress Notes (Signed)
Referral has been placed. Patient has been notified. 

## 2020-02-10 NOTE — Telephone Encounter (Signed)
Please refer and call to tell her. I apparently didn't place the referral. In the future, if she doesn't hear about referrals, let us know after 2 weeks.

## 2020-03-10 ENCOUNTER — Encounter (HOSPITAL_COMMUNITY): Payer: Self-pay

## 2020-03-10 ENCOUNTER — Ambulatory Visit (HOSPITAL_COMMUNITY)
Admission: EM | Admit: 2020-03-10 | Discharge: 2020-03-10 | Disposition: A | Discharge status: Home / Self Care | Payer: No Typology Code available for payment source | Attending: Internal Medicine | Admitting: Internal Medicine

## 2020-03-10 ENCOUNTER — Other Ambulatory Visit: Payer: Self-pay

## 2020-03-10 ENCOUNTER — Ambulatory Visit (INDEPENDENT_AMBULATORY_CARE_PROVIDER_SITE_OTHER): Payer: No Typology Code available for payment source

## 2020-03-10 DIAGNOSIS — R079 Chest pain, unspecified: Secondary | ICD-10-CM | POA: Diagnosis not present

## 2020-03-10 DIAGNOSIS — R059 Cough, unspecified: Secondary | ICD-10-CM | POA: Diagnosis not present

## 2020-03-10 DIAGNOSIS — R0789 Other chest pain: Secondary | ICD-10-CM

## 2020-03-10 NOTE — ED Triage Notes (Signed)
Pt in with c/o CP that has been going on all week, states when she takes deep breath it's a sharp pain  Denies radiation to arm/jaw/ back, dizziness, N/V  Pt has been taking aleve for sxs

## 2020-03-10 NOTE — Discharge Instructions (Addendum)
Ibuprofen for discomfort.  Return if any problems.  

## 2020-03-10 NOTE — ED Provider Notes (Signed)
[facility]    CSN: [id number] Arrival date & time: 03/10/20  1307      History   Chief Complaint Chief Complaint  Patient presents with  . Chest Pain    HPI Carla Wang is a 20 y.o. female.   The history is provided by the patient. No language interpreter was used.  Chest Pain Pain location:  Substernal area Pain quality: aching   Pain radiates to:  Does not radiate Pain severity:  Moderate Onset quality:  Gradual Timing:  Constant Progression:  Worsening Chronicity:  New Relieved by:  Nothing Ineffective treatments:  None tried Associated symptoms: no abdominal pain     Past Medical History:  Diagnosis Date  . Allergy     Patient Active Problem List   Diagnosis Date Noted  . Chronic allergic rhinitis 11/29/2019  . Chronic right ear pain 11/29/2019  . Oral contraceptive use 11/29/2019  . Acne vulgaris 10/21/2017  . Seborrheic dermatitis 10/21/2017  . Eczema 10/21/2017  . Dysmenorrhea 10/20/2015  . Scoliosis 09/08/2014    Past Surgical History:  Procedure Laterality Date  . HAND SURGERY    . INGUINAL HERNIA REPAIR      OB History   No obstetric history on file.      Home Medications    Prior to Admission medications   Medication Sig Start Date End Date Taking? Authorizing Provider  fexofenadine (ALLEGRA ALLERGY) 180 MG tablet Take 1 tablet (180 mg total) by mouth daily. 04/20/19   Leamon Arnt, MD  fluticasone (FLONASE) 50 MCG/ACT nasal spray Place 2 sprays into both nostrils daily. 11/29/19   Leamon Arnt, MD  ibuprofen (ADVIL,MOTRIN) 600 MG tablet Take 1 tablet (600 mg total) by mouth every 6 (six) hours as needed. 02/04/18   Wieters, Hallie C, PA-C  montelukast (SINGULAIR) 10 MG tablet Take 1 tablet (10 mg total) by mouth at bedtime. 12/01/18   Briscoe Deutscher, DO  norethindrone-ethinyl estradiol-iron (ESTROSTEP FE,TILIA FE,TRI-LEGEST FE) 1-20/1-30/1-35 MG-MCG tablet Take 1 tablet by mouth daily.    [provider]     Family History Family History  Problem Relation Age of Onset  . Diabetes Mother   . Healthy Brother   . Diabetes Maternal Aunt   . Diabetes Maternal Grandmother   . Prostate cancer Paternal Grandfather   . Heart disease Neg Hx   . Hyperlipidemia Neg Hx   . Hypertension Neg Hx     Social History Social History   Tobacco Use  . Smoking status: Never Smoker  . Smokeless tobacco: Never Used  Substance Use Topics  . Alcohol use: Yes  . Drug use: No     Allergies   Patient has no known allergies.   Review of Systems Review of Systems  Cardiovascular: Positive for chest pain.  Gastrointestinal: Negative for abdominal pain.  All other systems reviewed and are negative.    Physical Exam Triage Vital Signs ED Triage Vitals  Enc Vitals Group     BP 03/10/20 1511 133/82     Pulse Rate 03/10/20 1511 87     Resp 03/10/20 1511 17     Temp 03/10/20 1511 98.3 F (36.8 C)     Temp Source 03/10/20 1511 Temporal     SpO2 03/10/20 1511 100 %     Weight --      Height --      Head Circumference --      Peak Flow --      Pain Score 03/10/20 1509  6     Pain Loc --      Pain Edu? --      Excl. in [place]? --    No data found.  Updated Vital Signs BP 133/82   Pulse 87   Temp 98.3 F (36.8 C) (Temporal)   Resp 17   LMP 02/23/2020 (Exact Date)   SpO2 100%   Visual Acuity Right Eye Distance:   Left Eye Distance:   Bilateral Distance:    Right Eye Near:   Left Eye Near:    Bilateral Near:     Physical Exam Vitals and nursing note reviewed.  Constitutional:      Appearance: She is well-developed and well-nourished.  HENT:     Head: Normocephalic.  Eyes:     Extraocular Movements: EOM normal.  Cardiovascular:     Rate and Rhythm: Normal rate and regular rhythm.     Heart sounds: Normal heart sounds.  Pulmonary:     Effort: Pulmonary effort is normal.  Chest:     Chest wall: Tenderness present.     Comments: Tender mid sternum  Abdominal:     General:  There is no distension.  Musculoskeletal:        General: Normal range of motion.     Cervical back: Normal range of motion.  Neurological:     General: No focal deficit present.     Mental Status: She is alert and oriented to person, place, and time.  Psychiatric:        Mood and Affect: Mood and affect normal.      UC Treatments / Results  Labs (all labs ordered are listed, but only abnormal results are displayed) Labs Reviewed - No data to display  EKG   Radiology DG Chest 2 View  Result Date: 03/10/2020 CLINICAL DATA:  Chest pain on going all week cough particularly with deep inspiration. EXAM: CHEST - 2 VIEW COMPARISON:  02/04/2018 FINDINGS: The heart size and mediastinal contours are within normal limits. Both lungs are clear. The visualized skeletal structures are unremarkable. 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 Electronically Signed   By: Miachel Roux M.D.   On: 03/10/2020 16:01    Procedures Procedures (including critical care time)  Medications Ordered in UC Medications - No data to display  Initial Impression / Assessment and Plan / UC Course  I have reviewed the triage vital signs and the nursing notes.  Pertinent labs & imaging results that were available during my care of the patient were reviewed by me and considered in my medical decision making (see chart for details).     MDM:  ekg and chest xray normal.  Pt counseled on muscular chest pain Final Clinical Impressions(s) / UC Diagnoses   Final diagnoses:  Chest wall pain     Discharge Instructions     Ibuprofen for discomfort.  Return if any problems.    ED Prescriptions    None     PDMP not reviewed this encounter.  An After Visit Summary was printed and given to the patient.    Fransico Meadow, [place] 03/10/20 2026

## 2020-03-15 ENCOUNTER — Encounter (INDEPENDENT_AMBULATORY_CARE_PROVIDER_SITE_OTHER): Payer: Self-pay | Admitting: Otolaryngology

## 2020-03-15 ENCOUNTER — Ambulatory Visit (INDEPENDENT_AMBULATORY_CARE_PROVIDER_SITE_OTHER): Payer: No Typology Code available for payment source | Admitting: Otolaryngology

## 2020-03-15 ENCOUNTER — Other Ambulatory Visit: Payer: Self-pay

## 2020-03-15 VITALS — Temp 97.3°F

## 2020-03-15 DIAGNOSIS — H6983 Other specified disorders of Eustachian tube, bilateral: Secondary | ICD-10-CM

## 2020-03-15 DIAGNOSIS — J310 Chronic rhinitis: Secondary | ICD-10-CM | POA: Diagnosis not present

## 2020-03-15 DIAGNOSIS — H65112 Acute and subacute allergic otitis media (mucoid) (sanguinous) (serous), left ear: Secondary | ICD-10-CM | POA: Diagnosis not present

## 2020-03-15 NOTE — Progress Notes (Signed)
HPI: Carla Wang is a 20 y.o. female who presents is referred by her PCP Dr. Jonni Wang for evaluation of ear complaints.  She has intermittent pain mostly in the right ear when the seasons change.  She does have history of allergies and uses allergy nasal spray Flonase.  However she only uses the Flonase intermittently and not on a regular basis.  She also uses Singulair and Allegra.  She does better if she takes her allergy medication concerning her ears.  However she has not seen an allergist.  More recently over the past week has had more problems on the left side with congestion and mild discomfort in the left ear..  Past Medical History:  Diagnosis Date  . Allergy    Past Surgical History:  Procedure Laterality Date  . HAND SURGERY    . INGUINAL HERNIA REPAIR     Social History   Socioeconomic History  . Marital status: Single    Spouse name: Not on file  . Number of children: Not on file  . Years of education: Not on file  . Highest education level: Not on file  Occupational History  . Occupation: Ship broker  . Occupation: gradutates in 2022    Employer: [employer]  Tobacco Use  . Smoking status: Never Smoker  . Smokeless tobacco: Never Used  Substance and Sexual Activity  . Alcohol use: Yes  . Drug use: No  . Sexual activity: Yes    Birth control/protection: Pill  Other Topics Concern  . Not on file  Social History Narrative   S   Social Determinants of Health   Financial Resource Strain: Not on file  Food Insecurity: Not on file  Transportation Needs: Not on file  Physical Activity: Not on file  Stress: Not on file  Social Connections: Not on file   Family History  Problem Relation Age of Onset  . Diabetes Mother   . Healthy Brother   . Diabetes Maternal Aunt   . Diabetes Maternal Grandmother   . Prostate cancer Paternal Grandfather   . Heart disease Neg Hx   . Hyperlipidemia Neg Hx   . Hypertension Neg Hx    No Known Allergies Prior to Admission  medications   Medication Sig Start Date End Date Taking? Authorizing Provider  fexofenadine (ALLEGRA ALLERGY) 180 MG tablet Take 1 tablet (180 mg total) by mouth daily. 04/20/19   Leamon Arnt, MD  fluticasone (FLONASE) 50 MCG/ACT nasal spray Place 2 sprays into both nostrils daily. 11/29/19   Leamon Arnt, MD  ibuprofen (ADVIL,MOTRIN) 600 MG tablet Take 1 tablet (600 mg total) by mouth every 6 (six) hours as needed. 02/04/18   Wieters, Hallie C, PA-C  montelukast (SINGULAIR) 10 MG tablet Take 1 tablet (10 mg total) by mouth at bedtime. 12/01/18   Briscoe Deutscher, DO  norethindrone-ethinyl estradiol-iron (ESTROSTEP FE,TILIA FE,TRI-LEGEST FE) 1-20/1-30/1-35 MG-MCG tablet Take 1 tablet by mouth daily.    [provider]     Positive ROS: Otherwise negative  All other systems have been reviewed and were otherwise negative with the exception of those mentioned in the HPI and as above.  Physical Exam: Constitutional: Alert, well-appearing, no acute distress Ears: External ears without lesions or tenderness.  Right ear canal and right TM are clear in the office today.  Left ear canal is clear however patient has a mucoid left otitis media.  On tuning fork testing she heard better in the right ear with Weber lateralizing to the left side.  AC > BC on the right side and BC > AC on the left side. Nasal: External nose without lesions. Septum relatively midline with mild rhinitis..  Both middle meatus regions are clear with no clinical evidence of acute infection.  Mucus discharge within nasal cavity is clear. Oral: Lips and gums without lesions. Tongue and palate mucosa without lesions. Posterior oropharynx clear. Neck: No palpable adenopathy or masses Respiratory: Breathing comfortably  Skin: No facial/neck lesions or rash noted.  Procedures  Assessment: Left mucoid otitis media. History of allergic rhinitis with eustachian tube dysfunction.  Plan: Recommended regular use of the  Flonase 2 sprays each nostril at night as this should help with nasal sinus symptoms as well as eustachian tube dysfunction. I discussed with her that today she has a left otitis media with effusion and prescribed Augmentin 875 mg twice daily for 1week.  Also gave her some samples of Norel AD to take as a decongestant and antihistamine over the next few days. Reviewed with her concerning allergies and eustachian tube problems.  She might benefit by seeing an allergist and discuss this with her. She will notify us if left ear is not cleared up within 2 weeks.   Radene Journey, MD   CC:

## 2020-03-22 ENCOUNTER — Ambulatory Visit: Payer: 59 | Admitting: Physician Assistant

## 2020-07-31 ENCOUNTER — Other Ambulatory Visit: Payer: Self-pay

## 2020-07-31 ENCOUNTER — Encounter (HOSPITAL_COMMUNITY): Payer: Self-pay

## 2020-07-31 ENCOUNTER — Ambulatory Visit (HOSPITAL_COMMUNITY)
Admission: EM | Admit: 2020-07-31 | Discharge: 2020-07-31 | Disposition: A | Discharge status: Home / Self Care | Payer: No Typology Code available for payment source | Attending: Physician Assistant | Admitting: Physician Assistant

## 2020-07-31 ENCOUNTER — Ambulatory Visit (INDEPENDENT_AMBULATORY_CARE_PROVIDER_SITE_OTHER): Payer: No Typology Code available for payment source

## 2020-07-31 DIAGNOSIS — M79675 Pain in left toe(s): Secondary | ICD-10-CM | POA: Diagnosis not present

## 2020-07-31 DIAGNOSIS — S99922A Unspecified injury of left foot, initial encounter: Secondary | ICD-10-CM

## 2020-07-31 DIAGNOSIS — L03031 Cellulitis of right toe: Secondary | ICD-10-CM | POA: Diagnosis not present

## 2020-07-31 MED ORDER — MUPIROCIN 2 % EX OINT: 1.0000 "application " | TOPICAL_OINTMENT | Freq: Two times a day (BID) | CUTANEOUS | 0 refills | Status: DC

## 2020-07-31 MED ORDER — AMOXICILLIN-POT CLAVULANATE 875-125 MG PO TABS: 1.0000 | ORAL_TABLET | Freq: Two times a day (BID) | ORAL | 0 refills | Status: DC

## 2020-07-31 NOTE — ED Provider Notes (Signed)
[facility]    CSN: [id number] Arrival date & time: 07/31/20  1048      History   Chief Complaint Chief Complaint  Patient presents with  . Toe Injury    HPI Carla Wang is a 20 y.o. female.   Patient presents today with a 2-day history of left great toe pain.  Reports that she hit her left great toe into the wooden base of a friend shoe as they were walking and has had persistent pain since that time.  Her left toenail has lifted and she has developed erythema of lateral nailbed with associated purulent drainage.  She has applied topical antibiotic medication without improvement of symptoms.  She reports to continues to be very painful with pain rating rated 7 on a 0-10 pain scale, localized to proximal left great toe without radiation, worse with attempted ambulation or palpation, no alleviating factors identified.  Denies any numbness, deformity, paresthesia.  She has not tried any over-the-counter medications for symptom management.  She denies any recent antibiotic use.  Denies history of diabetes, recurrent skin infections, history of MRSA, immunosuppression.     Past Medical History:  Diagnosis Date  . Allergy     Patient Active Problem List   Diagnosis Date Noted  . Chronic allergic rhinitis 11/29/2019  . Chronic right ear pain 11/29/2019  . Oral contraceptive use 11/29/2019  . Acne vulgaris 10/21/2017  . Seborrheic dermatitis 10/21/2017  . Eczema 10/21/2017  . Dysmenorrhea 10/20/2015  . Scoliosis 09/08/2014    Past Surgical History:  Procedure Laterality Date  . HAND SURGERY    . INGUINAL HERNIA REPAIR      OB History   No obstetric history on file.      Home Medications    Prior to Admission medications   Medication Sig Start Date End Date Taking? Authorizing Provider  amoxicillin-clavulanate (AUGMENTIN) 875-125 MG tablet Take 1 tablet by mouth every 12 (twelve) hours. 07/31/20  Yes Jathniel Smeltzer, Junie Panning K, PA-C  mupirocin ointment  (BACTROBAN) 2 % Apply 1 application topically 2 (two) times daily. 07/31/20  Yes Braylin Formby, Derry Skill, PA-C  fexofenadine (ALLEGRA ALLERGY) 180 MG tablet Take 1 tablet (180 mg total) by mouth daily. 04/20/19   Leamon Arnt, MD  fluticasone (FLONASE) 50 MCG/ACT nasal spray Place 2 sprays into both nostrils daily. 11/29/19   Leamon Arnt, MD  ibuprofen (ADVIL,MOTRIN) 600 MG tablet Take 1 tablet (600 mg total) by mouth every 6 (six) hours as needed. 02/04/18   Wieters, Hallie C, PA-C  montelukast (SINGULAIR) 10 MG tablet Take 1 tablet (10 mg total) by mouth at bedtime. 12/01/18   Briscoe Deutscher, DO  norethindrone-ethinyl estradiol-iron (ESTROSTEP FE,TILIA FE,TRI-LEGEST FE) 1-20/1-30/1-35 MG-MCG tablet Take 1 tablet by mouth daily.    [provider]    Family History Family History  Problem Relation Age of Onset  . Diabetes Mother   . Healthy Brother   . Diabetes Maternal Aunt   . Diabetes Maternal Grandmother   . Prostate cancer Paternal Grandfather   . Heart disease Neg Hx   . Hyperlipidemia Neg Hx   . Hypertension Neg Hx     Social History Social History   Tobacco Use  . Smoking status: Never Smoker  . Smokeless tobacco: Never Used  Substance Use Topics  . Alcohol use: Yes  . Drug use: No     Allergies   Patient has no known allergies.   Review of Systems Review of Systems  Constitutional: Positive for  activity change. Negative for appetite change, fatigue and fever.  Respiratory: Negative for cough and shortness of breath.   Cardiovascular: Negative for chest pain.  Gastrointestinal: Negative for abdominal pain, diarrhea, nausea and vomiting.  Musculoskeletal: Positive for arthralgias, gait problem and joint swelling. Negative for myalgias.  Skin: Positive for wound.  Neurological: Negative for dizziness, weakness, light-headedness, numbness and headaches.     Physical Exam Triage Vital Signs ED Triage Vitals  Enc Vitals Group     BP 07/31/20 1103 139/87      Pulse Rate 07/31/20 1103 82     Resp 07/31/20 1103 18     Temp 07/31/20 1103 99.6 F (37.6 C)     Temp Source 07/31/20 1103 Oral     SpO2 07/31/20 1103 99 %     Weight --      Height --      Head Circumference --      Peak Flow --      Pain Score 07/31/20 1105 7     Pain Loc --      Pain Edu? --      Excl. in [place]? --    No data found.  Updated Vital Signs BP 139/87 (BP Location: Right Arm)   Pulse 82   Temp 99.6 F (37.6 C) (Oral)   Resp 18   LMP 07/17/2020   SpO2 99%   Visual Acuity Right Eye Distance:   Left Eye Distance:   Bilateral Distance:    Right Eye Near:   Left Eye Near:    Bilateral Near:     Physical Exam Vitals reviewed.  Constitutional:      General: She is awake. She is not in acute distress.    Appearance: Normal appearance. She is normal weight. She is not ill-appearing.     Comments: Very pleasant female appears stated age in no acute distress  HENT:     Head: Normocephalic and atraumatic.  Cardiovascular:     Rate and Rhythm: Normal rate and regular rhythm.     Pulses:          Posterior tibial pulses are 2+ on the right side and 2+ on the left side.     Heart sounds: Normal heart sounds. No murmur heard.     Comments: Unable to assess capillary refill due to nail polish Pulmonary:     Effort: Pulmonary effort is normal.     Breath sounds: Normal breath sounds. No wheezing, rhonchi or rales.     Comments: Clear to auscultation bilaterally Abdominal:     Palpations: Abdomen is soft.     Tenderness: There is no abdominal tenderness.  Musculoskeletal:     Left foot: Normal range of motion. Tenderness and bony tenderness present. No swelling or deformity.  Feet:     Left foot:     Protective Sensation: 10 sites tested. 10 sites sensed.     Skin integrity: Erythema and warmth present.     Comments: Erythema noted at lateral nail fold of left great toe with some purulent drainage noted.  Patient has tenderness palpation of proximal phalanx of  left great toe.  Normal active range of motion with flexion and extension.  No deformity noted.  Left great toenail is lifted but attached at nailbed. Psychiatric:        Behavior: Behavior is cooperative.      UC Treatments / Results  Labs (all labs ordered are listed, but only abnormal results are displayed) Labs Reviewed - No  data to display  EKG   Radiology DG Toe Great Left  Result Date: 07/31/2020 CLINICAL DATA:  Stubbed toe EXAM: LEFT GREAT TOE COMPARISON:  None. FINDINGS: No acute fracture or dislocation. Joint spaces and alignment are maintained. No area of erosion or osseous destruction. No unexpected radiopaque foreign body. Soft tissues are unremarkable. IMPRESSION: No acute fracture or dislocation. Electronically Signed   By: Valentino Saxon MD   On: 07/31/2020 12:17    Procedures Procedures (including critical care time)  Medications Ordered in UC Medications - No data to display  Initial Impression / Assessment and Plan / UC Course  I have reviewed the triage vital signs and the nursing notes.  Pertinent labs & imaging results that were available during my care of the patient were reviewed by me and considered in my medical decision making (see chart for details).     X-ray was normal.  We will treat for paronychia with Augmentin and Bactroban.  Discussed that patient should avoid removing toenail as this could lead to damage to the growth plate and impact ability to grow nail in the future.  Recommended she keep this covered and apply ointment with dressing changes.  Recommended she keep nail trimmed to avoid catching this on other things.  Discussed alarm symptoms that warrant reevaluation.  Strict return precautions given to which patient expressed understanding.  Final Clinical Impressions(s) / UC Diagnoses   Final diagnoses:  Injury of left great toe, initial encounter  Paronychia of great toe, right     Discharge Instructions     Your x-ray was  normal.  We are going to start an antibiotic to cover for infection.  Please keep toenail wrapped in treatment as it grows out.  It is possible this will fall off.  If you have any worsening symptoms including fever, spread of erythema, nausea, vomiting please return immediately for reevaluation.    ED Prescriptions    Medication Sig Dispense Auth. Provider   amoxicillin-clavulanate (AUGMENTIN) 875-125 MG tablet Take 1 tablet by mouth every 12 (twelve) hours. 14 tablet Khia Dieterich K, PA-C   mupirocin ointment (BACTROBAN) 2 % Apply 1 application topically 2 (two) times daily. 22 g Bronc Brosseau K, PA-C     PDMP not reviewed this encounter.   Terrilee Croak, PA-C 07/31/20 1225

## 2020-07-31 NOTE — ED Triage Notes (Signed)
Pt presents with left great toe injury after stubbing it on someone's wooden ortho shoe 2 days ago; area is swollen, discolored, and draining from nail.

## 2020-07-31 NOTE — Discharge Instructions (Addendum)
Your x-ray was normal.  We are going to start an antibiotic to cover for infection.  Please keep toenail wrapped in treatment as it grows out.  It is possible this will fall off.  If you have any worsening symptoms including fever, spread of erythema, nausea, vomiting please return immediately for reevaluation.

## 2020-09-09 ENCOUNTER — Encounter: Payer: Self-pay | Admitting: Family Medicine

## 2020-09-22 ENCOUNTER — Ambulatory Visit (INDEPENDENT_AMBULATORY_CARE_PROVIDER_SITE_OTHER): Payer: PRIVATE HEALTH INSURANCE | Admitting: Podiatry

## 2020-09-22 ENCOUNTER — Other Ambulatory Visit: Payer: Self-pay

## 2020-09-22 DIAGNOSIS — L600 Ingrowing nail: Secondary | ICD-10-CM | POA: Diagnosis not present

## 2020-09-22 DIAGNOSIS — M79676 Pain in unspecified toe(s): Secondary | ICD-10-CM

## 2020-09-22 NOTE — Progress Notes (Signed)
  Subjective:  Patient ID: Carla Wang, female    DOB: 12/01/2000,  MRN: [id number]  Chief Complaint  Patient presents with   Toe Injury    left foot great toe injury   20 y.o. female presents with the above complaint. History confirmed with patient.  Objective:  Physical Exam: warm, good capillary refill, no trophic changes or ulcerative lesions, normal DP and PT pulses, and normal sensory exam. Left Foot: left great toe nail distal lysis, clear proximally. Assessment:   1. Ingrown nail   2. Pain around toenail    Plan:  Patient was evaluated and treated and all questions answered.  Onychodystrophy -Nail gently debrided of loose nail -Advised patient may or not   Return if symptoms worsen or fail to improve.

## 2020-11-29 ENCOUNTER — Encounter: Payer: Self-pay | Admitting: Family Medicine

## 2020-11-29 ENCOUNTER — Ambulatory Visit (INDEPENDENT_AMBULATORY_CARE_PROVIDER_SITE_OTHER): Payer: PRIVATE HEALTH INSURANCE | Admitting: Family Medicine

## 2020-11-29 ENCOUNTER — Other Ambulatory Visit: Payer: Self-pay

## 2020-11-29 VITALS — BP 122/86 | HR 101 | Temp 97.0°F | Ht 65.0 in | Wt 163.0 lb

## 2020-11-29 DIAGNOSIS — H6993 Unspecified Eustachian tube disorder, bilateral: Secondary | ICD-10-CM | POA: Insufficient documentation

## 2020-11-29 DIAGNOSIS — Z0000 Encounter for general adult medical examination without abnormal findings: Secondary | ICD-10-CM

## 2020-11-29 DIAGNOSIS — N946 Dysmenorrhea, unspecified: Secondary | ICD-10-CM

## 2020-11-29 DIAGNOSIS — J309 Allergic rhinitis, unspecified: Secondary | ICD-10-CM

## 2020-11-29 DIAGNOSIS — Z23 Encounter for immunization: Secondary | ICD-10-CM | POA: Diagnosis not present

## 2020-11-29 DIAGNOSIS — Z3041 Encounter for surveillance of contraceptive pills: Secondary | ICD-10-CM

## 2020-11-29 DIAGNOSIS — H6983 Other specified disorders of Eustachian tube, bilateral: Secondary | ICD-10-CM | POA: Diagnosis not present

## 2020-11-29 HISTORY — DX: Unspecified eustachian tube disorder, bilateral: H69.93

## 2020-11-29 LAB — LIPID PANEL
Cholesterol: 126 mg/dL (ref 0–200)
HDL: 52.1 mg/dL (ref >39.00)
LDL Cholesterol: 65 mg/dL (ref 0–99)
NonHDL: 74.36
Total CHOL/HDL Ratio: 2
Triglycerides: 46 mg/dL (ref 0.0–149.0)
VLDL: 9.2 mg/dL (ref 0.0–40.0)

## 2020-11-29 LAB — CBC WITH DIFFERENTIAL/PLATELET
Basophils Absolute: 0 10*3/uL (ref 0.0–0.1)
Basophils Relative: 0.5 % (ref 0.0–3.0)
Eosinophils Absolute: 0.1 10*3/uL (ref 0.0–0.7)
Eosinophils Relative: 1.5 % (ref 0.0–5.0)
HCT: 40.1 % (ref 36.0–46.0)
Hemoglobin: 13 g/dL (ref 12.0–15.0)
Lymphocytes Relative: 37.8 % (ref 12.0–46.0)
Lymphs Abs: 2.1 10*3/uL (ref 0.7–4.0)
MCHC: 32.5 g/dL (ref 30.0–36.0)
MCV: 85.3 fl (ref 78.0–100.0)
Monocytes Absolute: 0.4 10*3/uL (ref 0.1–1.0)
Monocytes Relative: 7.6 % (ref 3.0–12.0)
Neutro Abs: 2.9 10*3/uL (ref 1.4–7.7)
Neutrophils Relative %: 52.6 % (ref 43.0–77.0)
Platelets: 177 10*3/uL (ref 150.0–400.0)
RBC: 4.7 Mil/uL (ref 3.87–5.11)
RDW: 13.5 % (ref 11.5–15.5)
WBC: 5.5 10*3/uL (ref 4.0–10.5)

## 2020-11-29 LAB — COMPREHENSIVE METABOLIC PANEL
ALT: 11 U/L (ref 0–35)
AST: 16 U/L (ref 0–37)
Albumin: 4.3 g/dL (ref 3.5–5.2)
Alkaline Phosphatase: 35 U/L — ABNORMAL LOW (ref 39–117)
BUN: 9 mg/dL (ref 6–23)
CO2: 27 mEq/L (ref 19–32)
Calcium: 9.4 mg/dL (ref 8.4–10.5)
Chloride: 103 mEq/L (ref 96–112)
Creatinine, Ser: 0.73 mg/dL (ref 0.40–1.20)
GFR: 116.68 mL/min (ref >60.00)
Glucose, Bld: 85 mg/dL (ref 70–99)
Potassium: 3.6 mEq/L (ref 3.5–5.1)
Sodium: 138 mEq/L (ref 135–145)
Total Bilirubin: 0.4 mg/dL (ref 0.2–1.2)
Total Protein: 7.5 g/dL (ref 6.0–8.3)

## 2020-11-29 NOTE — Progress Notes (Signed)
Subjective  Chief Complaint  Patient presents with   Annual Exam    Fasting    HPI: Carla Wang is a 20 y.o. female who presents to [facility] today for a Female Wellness Visit.  She also has the concerns and/or needs as listed above in the chief complaint. These will be addressed in addition to the Health Maintenance Visit.   Wellness Visit: annual visit with health maintenance review and exam without Pap  HM: sees gyn for female wellness and is current. Ocp for bc and dysmenorrhea. Reports neg STD screen at gyn. + sexually active. 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 Doing well.  Chronic disease management visit and/or acute problem visit: Allergies: well controlled on allegra and flonase. Reviewed ent eval. ETD is better.   Assessment  1. Annual physical exam   2. Oral contraceptive use   3. Dysmenorrhea   4. ETD (Eustachian tube dysfunction), bilateral   5. Chronic allergic rhinitis      Plan  Female Wellness Visit: Age appropriate Health Maintenance and Prevention measures were discussed with patient. Included topics are cancer screening recommendations, ways to keep healthy (see AVS) including dietary and exercise recommendations, regular eye and dental care, use of seat belts, and avoidance of moderate alcohol use and tobacco use.  BMI: discussed patient's BMI and encouraged positive lifestyle modifications to help get to or maintain a target BMI. HM needs and immunizations were addressed and ordered. See below for orders. See HM and immunization section for updates. Routine labs and screening tests ordered including cmp, cbc and lipids where appropriate. Discussed recommendations regarding Vit D and calcium supplementation (see AVS)  Chronic disease f/u and/or acute problem visit: (deemed necessary to be done in addition to the wellness visit): Allergies and etd:  well controlled.    Follow up: Return in about 1 year (around 11/29/2021) for complete physical.   Orders Placed This Encounter  Procedures   CBC with Differential/Platelet   Comprehensive metabolic panel   Hepatitis C antibody   Lipid panel    No orders of the defined types were placed in this encounter.      Body mass index is 27.12 kg/m. Wt Readings from Last 3 Encounters:  11/29/20 163 lb (73.9 kg)  11/29/19 157 lb (71.2 kg)  04/20/19 140 lb (63.5 kg)     Patient Active Problem List   Diagnosis Date Noted   ETD (Eustachian tube dysfunction), bilateral 11/29/2020    eval by ENT 2021    Chronic allergic rhinitis 11/29/2019   Oral contraceptive use 11/29/2019   Acne vulgaris 10/21/2017    Seen by [facility] assessment: acne, moderate     Seborrheic dermatitis 10/21/2017    Followed by [facility]     Eczema 10/21/2017   Dysmenorrhea 10/20/2015    Started oral contraceptives for management at age 30.  This medical condition is well controlled.     Scoliosis 09/08/2014    Mild, evaluated by pediatric orthopedics in 2016.  Low risk of progression.  Surveillance only recommended.    Health Maintenance  Topic Date Due   Hepatitis C Screening  Never done   INFLUENZA VACCINE  10/02/2020   PAP-Cervical Cytology Screening  07/03/2022   PAP SMEAR-Modifier  10/22/2022   TETANUS/TDAP  11/28/2029   HPV VACCINES  Completed   HIV Screening  Completed   Immunization History  Administered Date(s) Administered   HPV 9-valent 09/27/2011,  11/29/2011, 04/01/2012   Influenza,inj,Quad PF,6+ Mos 12/01/2018, 11/29/2019   Tdap 11/29/2019   We updated and reviewed the patient's past history in detail and it is documented below. Allergies: Patient  reports current alcohol use. Past Medical History Patient  has a past medical history of Allergy. Past Surgical History Patient  has a past surgical history that includes Inguinal hernia repair and Hand surgery. Social History    Socioeconomic History   Marital status: Single    Spouse name: Not on file   Number of children: Not on file   Years of education: Not on file   Highest education level: Not on file  Occupational History   Occupation: Ship broker   Occupation: gradutates in 2022    Employer: [employer]  Tobacco Use   Smoking status: Never   Smokeless tobacco: Never  Substance and Sexual Activity   Alcohol use: Yes   Drug use: No   Sexual activity: Yes    Birth control/protection: Pill  Other Topics Concern   Not on file  Social History Narrative   S   Social Determinants of Health   Financial Resource Strain: Not on file  Food Insecurity: Not on file  Transportation Needs: Not on file  Physical Activity: Not on file  Stress: Not on file  Social Connections: Not on file   Family History  Problem Relation Age of Onset   Diabetes Mother    Healthy Brother    Diabetes Maternal Aunt    Diabetes Maternal Grandmother    Prostate cancer Paternal Grandfather    Heart disease Neg Hx    Hyperlipidemia Neg Hx    Hypertension Neg Hx     Review of Systems: Constitutional: negative for fever or malaise Ophthalmic: negative for photophobia, double vision or loss of vision Cardiovascular: negative for chest pain, dyspnea on exertion, or new LE swelling Respiratory: negative for SOB or persistent cough Gastrointestinal: negative for abdominal pain, change in bowel habits or melena Genitourinary: negative for dysuria or gross hematuria, no abnormal uterine bleeding or disharge Musculoskeletal: negative for new gait disturbance or muscular weakness Integumentary: negative for new or persistent rashes, no breast lumps Neurological: negative for TIA or stroke symptoms Psychiatric: negative for SI or delusions Allergic/Immunologic: negative for hives  Patient Care Team    Relationship Specialty Notifications Start End  Leamon Arnt, MD PCP - General Family Medicine  11/29/19   Delma Post, PA-C  Dermatology  10/21/17   Hilts, Legrand Como, MD Consulting Physician Sports Medicine  04/20/19   Delsa Bern, MD Consulting Physician Obstetrics and Gynecology  11/29/19   Rozetta Nunnery, MD Consulting Physician Otolaryngology  11/29/20     Objective  Vitals: BP 122/86   Pulse (!) 101   Temp (!) 97 F (36.1 C) (Temporal)   Ht 5\' 5"  (1.651 m)   Wt 163 lb (73.9 kg)   LMP 11/15/2020 (Exact Date)   SpO2 100%   BMI 27.12 kg/m  General:  Well developed, well nourished, no acute distress  Psych:  Alert and orientedx3,normal mood and affect HEENT:  Normocephalic, atraumatic, non-icteric sclera, PERRL, supple neck without adenopathy, mass or thyromegaly Cardiovascular:  Normal S1, S2, RRR without gallop, rub or murmur Respiratory:  Good breath sounds bilaterally, CTAB with normal respiratory effort Gastrointestinal: normal bowel sounds, soft, non-tender, no noted masses. No HSM MSK: no deformities, contusions. Joints are without erythema or swelling.  Skin:  Warm, no rashes or suspicious lesions noted Neurologic:    Mental status  is normal. Gross motor and sensory exams are normal. Normal gait. No tremor    Commons side effects, risks, benefits, and alternatives for medications and treatment plan prescribed today were discussed, and the patient expressed understanding of the given instructions. Patient is instructed to call or message via MyChart if he/she has any questions or concerns regarding our treatment plan. No barriers to understanding were identified. We discussed Red Flag symptoms and signs in detail. Patient expressed understanding regarding what to do in case of urgent or emergency type symptoms.  Medication list was reconciled, printed and provided to the patient in AVS. Patient instructions and summary information was reviewed with the patient as documented in the AVS. This note was prepared with assistance of Dragon voice recognition software. Occasional  wrong-word or sound-a-like substitutions may have occurred due to the inherent limitations of voice recognition software  This visit occurred during the SARS-CoV-2 public health emergency.  Safety protocols were in place, including screening questions prior to the visit, additional usage of staff PPE, and extensive cleaning of exam room while observing appropriate contact time as indicated for disinfecting solutions.

## 2020-11-29 NOTE — Patient Instructions (Signed)
Please return in 12 months for your annual complete physical; please come fasting.   I will release your lab results to you on your MyChart account with further instructions. Please reply with any questions.    Today you were given your flu vaccination.    If you have any questions or concerns, please don't hesitate to send me a message via MyChart or call the office at [phone]. Thank you for visiting with [place] today! It's our pleasure caring for you.   Please do these things to maintain good health!  Exercise at least 30-45 minutes a day,  4-5 days a week.  Eat a low-fat diet with lots of fruits and vegetables, up to 7-9 servings per day. Drink plenty of water daily. Try to drink 8 8oz glasses per day. Seatbelts can save your life. Always wear your seatbelt. Place Smoke Detectors on every level of your home and check batteries every year. Schedule an appointment with an eye doctor for an eye exam every 1-2 years Safe sex - use condoms to protect yourself from STDs if you could be exposed to these types of infections. Use birth control if you do not want to become pregnant and are sexually active. Avoid heavy alcohol use. If you drink, keep it to less than 2 drinks/day and not every day. [place].  Choose someone you trust that could speak for you if you became unable to speak for yourself. Depression is common in our stressful world.If you're feeling down or losing interest in things you normally enjoy, please come in for a visit. If anyone is threatening or hurting you, please get help. Physical or Emotional Violence is never OK.

## 2020-11-30 LAB — HEPATITIS C ANTIBODY
Hepatitis C Ab: NONREACTIVE
SIGNAL TO CUT-OFF: 0.01 (ref <1.00)

## 2020-12-07 ENCOUNTER — Other Ambulatory Visit: Payer: Self-pay

## 2020-12-07 ENCOUNTER — Ambulatory Visit (INDEPENDENT_AMBULATORY_CARE_PROVIDER_SITE_OTHER): Payer: PRIVATE HEALTH INSURANCE | Admitting: Family

## 2020-12-07 ENCOUNTER — Encounter: Payer: Self-pay | Admitting: Family

## 2020-12-07 VITALS — BP 110/80 | HR 80 | Temp 97.3°F | Ht 65.0 in | Wt 163.6 lb

## 2020-12-07 DIAGNOSIS — S46911A Strain of unspecified muscle, fascia and tendon at shoulder and upper arm level, right arm, initial encounter: Secondary | ICD-10-CM

## 2020-12-07 MED ORDER — IBUPROFEN 600 MG PO TABS: 600.0000 mg | ORAL_TABLET | Freq: Three times a day (TID) | ORAL | 0 refills | Status: DC | PRN

## 2020-12-07 MED ORDER — CYCLOBENZAPRINE HCL 10 MG PO TABS: 10.0000 mg | ORAL_TABLET | Freq: Every day | ORAL | 0 refills | Status: DC

## 2020-12-07 NOTE — Patient Instructions (Signed)
Pick up the Ibuprofen and Cyclobenzaprine from your pharmacy. Start meds today. Apply heat for 54minutes up to 3x/day. Try to increase range of motion of shoulder as able, especially after applying the heat. Call the office in 1 week if no improvement.

## 2020-12-07 NOTE — Assessment & Plan Note (Signed)
No injury, started 2 days ago, denies numbness, tingling, decreased ROM, tenderness to palpation. Radiates to right neck. Starting Flexeril and Ibuprofen, heat tid for 85min, ROM exercises.

## 2020-12-07 NOTE — Progress Notes (Signed)
Acute Office Visit  Subjective:    Patient ID: Carla Wang, female    DOB: February 12, 2001, 20 y.o.   MRN: [id number]  Chief Complaint  Patient presents with  . Shoulder Pain    Right shoulder; Started on Tuesday after a day of work. She denies any strenuous activity. She has used OTC pain reliever, and ice. She says that it hurts to lift.     Shoulder Pain   Patient is in today for right shoulder pain. Started 2 days ago in the evening. 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 Decreased ROM and tender to touch.  Past Medical History:  Diagnosis Date  . Allergy     Past Surgical History:  Procedure Laterality Date  . HAND SURGERY    . INGUINAL HERNIA REPAIR       Outpatient Medications Prior to Visit  Medication Sig Dispense Refill  . fexofenadine (ALLEGRA ALLERGY) 180 MG tablet Take 1 tablet (180 mg total) by mouth daily.    . fluticasone (FLONASE) 50 MCG/ACT nasal spray Place 2 sprays into both nostrils daily. 16 g 6  . norethindrone-ethinyl estradiol-iron (ESTROSTEP FE,TILIA FE,TRI-LEGEST FE) 1-20/1-30/1-35 MG-MCG tablet Take 1 tablet by mouth daily.    . montelukast (SINGULAIR) 10 MG tablet Take 1 tablet (10 mg total) by mouth at bedtime. (Patient not taking: No sig reported) 30 tablet 3   No facility-administered medications prior to visit.    No Known Allergies  Review of Systems See HPI above.     Objective:    Physical Exam Vitals and nursing note reviewed.  Constitutional:      Appearance: Normal appearance.  Cardiovascular:     Rate and Rhythm: Normal rate and regular rhythm.  Pulmonary:     Effort: Pulmonary effort is normal.     Breath sounds: Normal breath sounds.  Musculoskeletal:        General: Tenderness (right shoulder) present.     Right shoulder: Tenderness present. No swelling or effusion. Decreased range of motion. Decreased strength.  Skin:    General: Skin is warm and dry.  Neurological:     Mental  Status: She is alert.  Psychiatric:        Behavior: Behavior normal.    BP 110/80   Pulse 80   Temp (!) 97.3 F (36.3 C) (Temporal)   Ht 5\' 5"  (1.651 m)   Wt 163 lb 9.6 oz (74.2 kg)   LMP 11/15/2020 (Exact Date)   SpO2 100%   BMI 27.22 kg/m  Wt Readings from Last 3 Encounters:  12/07/20 163 lb 9.6 oz (74.2 kg)  11/29/20 163 lb (73.9 kg)  11/29/19 157 lb (71.2 kg)    There are no preventive care reminders to display for this patient.  There are no preventive care reminders to display for this patient.       Assessment & Plan:   Problem List Items Addressed This Visit       Musculoskeletal and Integument   Right shoulder strain, initial encounter - Primary    No injury, started 2 days ago, denies numbness, tingling, decreased ROM, tenderness to palpation. Radiates to right neck. Starting Flexeril and Ibuprofen, heat tid for 78min, ROM exercises.        Meds ordered this encounter  Medications  . ibuprofen (ADVIL) 600 MG tablet    Sig: Take 1 tablet (600 mg total) by mouth every 8 (eight) hours as needed (take after eating).  Dispense:  30 tablet    Refill:  0    Order Specific Question:   Supervising Provider    Answer:   ANDY, CAMILLE L [[id number]]  . cyclobenzaprine (FLEXERIL) 10 MG tablet    Sig: Take 1 tablet (10 mg total) by mouth at bedtime.    Dispense:  30 tablet    Refill:  0    Order Specific Question:   Supervising Provider    Answer:   ANDY, CAMILLE L [[id number]]     Jeanie Sewer, NP

## 2020-12-16 ENCOUNTER — Other Ambulatory Visit: Payer: Self-pay

## 2020-12-16 ENCOUNTER — Ambulatory Visit (HOSPITAL_COMMUNITY)
Admission: EM | Admit: 2020-12-16 | Discharge: 2020-12-16 | Disposition: A | Discharge status: Home / Self Care | Payer: PRIVATE HEALTH INSURANCE | Attending: Medical Oncology | Admitting: Medical Oncology

## 2020-12-16 ENCOUNTER — Encounter (HOSPITAL_COMMUNITY): Payer: Self-pay

## 2020-12-16 DIAGNOSIS — Z8669 Personal history of other diseases of the nervous system and sense organs: Secondary | ICD-10-CM

## 2020-12-16 DIAGNOSIS — H6593 Unspecified nonsuppurative otitis media, bilateral: Secondary | ICD-10-CM | POA: Diagnosis not present

## 2020-12-16 DIAGNOSIS — H6502 Acute serous otitis media, left ear: Secondary | ICD-10-CM | POA: Diagnosis not present

## 2020-12-16 DIAGNOSIS — J069 Acute upper respiratory infection, unspecified: Secondary | ICD-10-CM | POA: Diagnosis not present

## 2020-12-16 MED ORDER — PREDNISONE 10 MG PO TABS: ORAL_TABLET | ORAL | 0 refills | Status: DC

## 2020-12-16 NOTE — ED Triage Notes (Signed)
Pt presents with left ear pain and fullness X 3 days.

## 2020-12-16 NOTE — ED Provider Notes (Signed)
[facility]    CSN: [id number] Arrival date & time: 12/16/20  1020      History   Chief Complaint Chief Complaint  Patient presents with   Otalgia   Ear Fullness    HPI Carla Wang is a 20 y.o. female.   HPI  Ear fullness: Patient states that for the past 3 days she has had nasal congestion, ear fullness and pain especially of the left ear as well as a mild dry cough.  She denies any fevers, shortness of breath, wheezing.  She has been using her normal medications for symptoms without any significant relief.  No known sick contacts.  Past Medical History:  Diagnosis Date   Allergy     Patient Active Problem List   Diagnosis Date Noted   Right shoulder strain, initial encounter 12/07/2020   ETD (Eustachian tube dysfunction), bilateral 11/29/2020   Chronic allergic rhinitis 11/29/2019   Oral contraceptive use 11/29/2019   Acne vulgaris 10/21/2017   Seborrheic dermatitis 10/21/2017   Eczema 10/21/2017   Dysmenorrhea 10/20/2015   Scoliosis 09/08/2014    Past Surgical History:  Procedure Laterality Date   HAND SURGERY     INGUINAL HERNIA REPAIR      OB History   No obstetric history on file.      Home Medications    Prior to Admission medications   Medication Sig Start Date End Date Taking? Authorizing Provider  cyclobenzaprine (FLEXERIL) 10 MG tablet Take 1 tablet (10 mg total) by mouth at bedtime. 12/07/20   Jeanie Sewer, NP  fexofenadine (ALLEGRA ALLERGY) 180 MG tablet Take 1 tablet (180 mg total) by mouth daily. 04/20/19   Leamon Arnt, MD  fluticasone (FLONASE) 50 MCG/ACT nasal spray Place 2 sprays into both nostrils daily. 11/29/19   Leamon Arnt, MD  ibuprofen (ADVIL) 600 MG tablet Take 1 tablet (600 mg total) by mouth every 8 (eight) hours as needed (take after eating). 12/07/20   Jeanie Sewer, NP  montelukast (SINGULAIR) 10 MG tablet Take 1 tablet (10 mg total) by mouth at bedtime. Patient not taking: No sig reported  12/01/18   Briscoe Deutscher, DO  norethindrone-ethinyl estradiol-iron (ESTROSTEP FE,TILIA FE,TRI-LEGEST FE) 1-20/1-30/1-35 MG-MCG tablet Take 1 tablet by mouth daily.    [provider]    Family History Family History  Problem Relation Age of Onset   Diabetes Mother    Healthy Brother    Diabetes Maternal Aunt    Diabetes Maternal Grandmother    Prostate cancer Paternal Grandfather    Heart disease Neg Hx    Hyperlipidemia Neg Hx    Hypertension Neg Hx     Social History Social History   Tobacco Use   Smoking status: Never   Smokeless tobacco: Never  Substance Use Topics   Alcohol use: Yes   Drug use: No     Allergies   Patient has no known allergies.   Review of Systems Review of Systems  As stated above in HPI Physical Exam Triage Vital Signs ED Triage Vitals  Enc Vitals Group     BP 12/16/20 1230 117/88     Pulse Rate 12/16/20 1230 88     Resp 12/16/20 1230 18     Temp 12/16/20 1230 98.4 F (36.9 C)     Temp Source 12/16/20 1230 Oral     SpO2 12/16/20 1230 100 %     Weight --      Height --      Head Circumference --  Peak Flow --      Pain Score 12/16/20 1233 4     Pain Loc --      Pain Edu? --      Excl. in [place]? --    No data found.  Updated Vital Signs BP 117/88 (BP Location: Left Arm)   Pulse 88   Temp 98.4 F (36.9 C) (Oral)   Resp 18   LMP 11/15/2020   SpO2 100%   Physical Exam Vitals and nursing note reviewed.  Constitutional:      General: She is not in acute distress.    Appearance: Normal appearance. She is not ill-appearing, toxic-appearing or diaphoretic.  HENT:     Head: Normocephalic and atraumatic.     Ears:     Comments: The left TM has fullness and opaque coloration without significant erythema.  The right TM appears normal without any bulging or erythema.    Nose: Congestion present.     Mouth/Throat:     Mouth: Mucous membranes are moist.     Pharynx: Oropharynx is clear. No oropharyngeal exudate or  posterior oropharyngeal erythema.  Eyes:     Extraocular Movements: Extraocular movements intact.     Pupils: Pupils are equal, round, and reactive to light.  Cardiovascular:     Rate and Rhythm: Normal rate and regular rhythm.     Heart sounds: Normal heart sounds.  Pulmonary:     Effort: Pulmonary effort is normal.     Breath sounds: Normal breath sounds.  Musculoskeletal:     Cervical back: Normal range of motion and neck supple.  Lymphadenopathy:     Cervical: No cervical adenopathy.  Skin:    General: Skin is warm.  Neurological:     Mental Status: She is alert and oriented to person, place, and time.     UC Treatments / Results  Labs (all labs ordered are listed, but only abnormal results are displayed) Labs Reviewed - No data to display  EKG   Radiology No results found.  Procedures Procedures (including critical care time)  Medications Ordered in UC Medications - No data to display  Initial Impression / Assessment and Plan / UC Course  I have reviewed the triage vital signs and the nursing notes.  Pertinent labs & imaging results that were available during my care of the patient were reviewed by me and considered in my medical decision making (see chart for details).     New.  I discussed with patient that she appears to have a viral illness which should self improve on its own over the next few days.  I have recommended that she continue her current medications and I am also going to add on low-dose prednisone to help prevent otitis media.  We discussed red flag signs and symptoms for this and I did discuss that should she have any fever, bilateral ear pain or worsening ear pain of the left to call our office and I would recommend that we will send in Augmentin for her. LMP: UTD.  Final Clinical Impressions(s) / UC Diagnoses   Final diagnoses:  None   Discharge Instructions   None    ED Prescriptions   None    PDMP not reviewed this encounter.    Hughie Closs, [place] 12/16/20 1307

## 2021-01-21 ENCOUNTER — Other Ambulatory Visit: Payer: Self-pay | Admitting: Family Medicine

## 2021-01-21 DIAGNOSIS — H6981 Other specified disorders of Eustachian tube, right ear: Secondary | ICD-10-CM

## 2021-05-11 ENCOUNTER — Ambulatory Visit (INDEPENDENT_AMBULATORY_CARE_PROVIDER_SITE_OTHER): Payer: PRIVATE HEALTH INSURANCE

## 2021-05-11 ENCOUNTER — Ambulatory Visit (INDEPENDENT_AMBULATORY_CARE_PROVIDER_SITE_OTHER): Payer: PRIVATE HEALTH INSURANCE | Admitting: Orthopaedic Surgery

## 2021-05-11 ENCOUNTER — Encounter: Payer: Self-pay | Admitting: Orthopaedic Surgery

## 2021-05-11 ENCOUNTER — Other Ambulatory Visit: Payer: Self-pay

## 2021-05-11 DIAGNOSIS — G8929 Other chronic pain: Secondary | ICD-10-CM

## 2021-05-11 DIAGNOSIS — M25561 Pain in right knee: Secondary | ICD-10-CM | POA: Diagnosis not present

## 2021-05-11 MED ORDER — TRAMADOL HCL 50 MG PO TABS
50.0000 mg | ORAL_TABLET | Freq: Two times a day (BID) | ORAL | 2 refills | Status: DC | PRN
Start: 2021-05-11 — End: 2021-07-26

## 2021-05-11 NOTE — Progress Notes (Signed)
? ?Office Visit Note ?  ?Patient: Carla Wang           ?Date of Birth: February 18, 2001           ?MRN: [id number] ?Visit Date: 05/11/2021 ?             ?Requested by: Leamon Arnt, MD ?[place] ?[place],  [place] [address] ?PCP: Leamon Arnt, MD ? ? ?Assessment & Plan: ?Visit Diagnoses:  ?1. Chronic pain of right knee   ? ? ?Plan: Impression is chronic right knee pain concerning for medial meniscus tear.  At this point, the patient symptoms have been ongoing for the past several years as she would like to go ahead and order an MRI to assess for meniscal pathology.  She will follow-up with [place] once this is been completed. ? ?Follow-Up Instructions: Return for after mri.  ? ?Orders:  ?Orders Placed This Encounter  ?Procedures  ? XR KNEE 3 VIEW RIGHT  ? MR Knee Right w/o contrast  ? ?Meds ordered this encounter  ?Medications  ? traMADol (ULTRAM) 50 MG tablet  ?  Sig: Take 1 tablet (50 mg total) by mouth every 12 (twelve) hours as needed.  ?  Dispense:  30 tablet  ?  Refill:  2  ? ? ? ? Procedures: ?No procedures performed ? ? ?Clinical Data: ?No additional findings. ? ? ?Subjective: ?Chief Complaint  ?Patient presents with  ? Right Knee - Pain  ? ? ?HPI patient is a pleasant 21 year old female who comes in today with right knee pain.  This initially began back in high school after taking weight training class.  She denies any specific injury but notes that this is when her pain started.  She underwent right knee cortisone injection about 2 to 3 years ago which significantly helped until this past January.  She denies any new injury or change in activity.  The pain she has been having has become more constant.  It is described as a constant ache worse with knee flexion as well as walking and increased activity.  The pain is located to the medial aspect.  She denies any mechanical symptoms.  She takes an occasional Tylenol or anti-inflammatories without significant relief. ? ?Review of Systems as detailed in  HPI.  All others reviewed and are negative. ? ? ?Objective: ?Vital Signs: There were no vitals taken for this visit. ? ?Physical Exam well-nourished female in no acute distress.  Alert and oriented x3. ? ?Ortho Exam right knee exam shows no effusion.  Range of motion 0 to 115 degrees.  Marked tenderness medial joint line.  Ligaments are stable.  She is neurovascular tact distally. ? ?Specialty Comments:  ?No specialty comments available. ? ?Imaging: ?XR KNEE 3 VIEW RIGHT ? ?Result Date: 05/11/2021 ?Patella alta.  Mild tricompartmental degenerative changes  ? ? ?PMFS History: ?Patient Active Problem List  ? Diagnosis Date Noted  ? Right shoulder strain, initial encounter 12/07/2020  ? ETD (Eustachian tube dysfunction), bilateral 11/29/2020  ? Chronic allergic rhinitis 11/29/2019  ? Oral contraceptive use 11/29/2019  ? Acne vulgaris 10/21/2017  ? Seborrheic dermatitis 10/21/2017  ? Eczema 10/21/2017  ? Dysmenorrhea 10/20/2015  ? Scoliosis 09/08/2014  ? ?Past Medical History:  ?Diagnosis Date  ? Allergy   ?  ?Family History  ?Problem Relation Age of Onset  ? Diabetes Mother   ? Healthy Brother   ? Diabetes Maternal Aunt   ? Diabetes Maternal Grandmother   ? Prostate cancer Paternal Grandfather   ?  Heart disease Neg Hx   ? Hyperlipidemia Neg Hx   ? Hypertension Neg Hx   ?  ?Past Surgical History:  ?Procedure Laterality Date  ? HAND SURGERY    ? INGUINAL HERNIA REPAIR    ? ?Social History  ? ?Occupational History  ? Occupation: Ship broker  ? Occupation: gradutates in 2022  ?  Employer: [employer]  ?Tobacco Use  ? Smoking status: Never  ? Smokeless tobacco: Never  ?Substance and Sexual Activity  ? Alcohol use: Yes  ? Drug use: No  ? Sexual activity: Yes  ?  Birth control/protection: Pill  ? ? ? ? ? ? ?

## 2021-05-21 ENCOUNTER — Other Ambulatory Visit: Payer: Self-pay

## 2021-05-21 ENCOUNTER — Ambulatory Visit
Admission: RE | Admit: 2021-05-21 | Discharge: 2021-05-21 | Disposition: A | Discharge status: Home / Self Care | Payer: PRIVATE HEALTH INSURANCE | Source: Ambulatory Visit | Attending: Orthopaedic Surgery | Admitting: Orthopaedic Surgery

## 2021-05-21 DIAGNOSIS — G8929 Other chronic pain: Secondary | ICD-10-CM

## 2021-05-30 ENCOUNTER — Encounter: Payer: Self-pay | Admitting: Orthopaedic Surgery

## 2021-05-30 ENCOUNTER — Other Ambulatory Visit: Payer: Self-pay

## 2021-05-30 ENCOUNTER — Ambulatory Visit (INDEPENDENT_AMBULATORY_CARE_PROVIDER_SITE_OTHER): Payer: PRIVATE HEALTH INSURANCE | Admitting: Orthopaedic Surgery

## 2021-05-30 DIAGNOSIS — S83241A Other tear of medial meniscus, current injury, right knee, initial encounter: Secondary | ICD-10-CM

## 2021-05-30 NOTE — Progress Notes (Signed)
? ?Office Visit Note ?  ?Patient: Carla Wang           ?Date of Birth: 09/29/2000           ?MRN: [id number] ?Visit Date: 05/30/2021 ?             ?Requested by: Leamon Arnt, MD ?[place] ?[place],  [place] [address] ?PCP: Leamon Arnt, MD ? ? ?Assessment & Plan: ?Visit Diagnoses:  ?1. Acute medial meniscus tear of right knee, initial encounter   ? ? ?Plan: Carla Wang returns today to discuss right knee MRI. ? ?Examination right knee shows pain along the medial joint line.  Pain with McMurray testing.  Range of motion is preserved.  No joint effusion.  Patella tracking is normal. ? ?MRI of the right knee shows an inferior posterior horn medial meniscus tear that is nondisplaced.  These findings were reviewed with the patient in detail.  She has had a cortisone injection in the past with temporary relief.  Due to ongoing symptoms despite conservative management and based on her options she has elected to move forward with arthroscopic partial medial meniscectomy versus repair as indicated.  Questions encouraged and answered.  We will call the patient in the near future to schedule surgery. ? ?Follow-Up Instructions: No follow-ups on file.  ? ?Orders:  ?No orders of the defined types were placed in this encounter. ? ?No orders of the defined types were placed in this encounter. ? ? ? ? Procedures: ?No procedures performed ? ? ?Clinical Data: ?No additional findings. ? ? ?Subjective: ?Chief Complaint  ?Patient presents with  ? Right Knee - Pain  ? ? ?HPI ? ?Review of Systems  ?Constitutional: Negative.   ?HENT: Negative.    ?Eyes: Negative.   ?Respiratory: Negative.    ?Cardiovascular: Negative.   ?Endocrine: Negative.   ?Musculoskeletal: Negative.   ?Neurological: Negative.   ?Hematological: Negative.   ?Psychiatric/Behavioral: Negative.    ?All other systems reviewed and are negative. ? ? ?Objective: ?Vital Signs: There were no vitals taken for this visit. ? ?Physical Exam ?Vitals and nursing note  reviewed.  ?Constitutional:   ?   Appearance: She is well-developed.  ?Pulmonary:  ?   Effort: Pulmonary effort is normal.  ?Skin: ?   General: Skin is warm.  ?   Capillary Refill: Capillary refill takes less than 2 seconds.  ?Neurological:  ?   Mental Status: She is alert and oriented to person, place, and time.  ?Psychiatric:     ?   Behavior: Behavior normal.     ?   Thought Content: Thought content normal.     ?   Judgment: Judgment normal.  ? ? ?Ortho Exam ? ?Specialty Comments:  ?No specialty comments available. ? ?Imaging: ?No results found. ? ? ?PMFS History: ?Patient Active Problem List  ? Diagnosis Date Noted  ? Acute medial meniscus tear of right knee 05/30/2021  ? Right shoulder strain, initial encounter 12/07/2020  ? ETD (Eustachian tube dysfunction), bilateral 11/29/2020  ? Chronic allergic rhinitis 11/29/2019  ? Oral contraceptive use 11/29/2019  ? Acne vulgaris 10/21/2017  ? Seborrheic dermatitis 10/21/2017  ? Eczema 10/21/2017  ? Dysmenorrhea 10/20/2015  ? Scoliosis 09/08/2014  ? ?Past Medical History:  ?Diagnosis Date  ? Allergy   ?  ?Family History  ?Problem Relation Age of Onset  ? Diabetes Mother   ? Healthy Brother   ? Diabetes Maternal Aunt   ? Diabetes Maternal Grandmother   ? Prostate cancer  Paternal Grandfather   ? Heart disease Neg Hx   ? Hyperlipidemia Neg Hx   ? Hypertension Neg Hx   ?  ?Past Surgical History:  ?Procedure Laterality Date  ? HAND SURGERY    ? INGUINAL HERNIA REPAIR    ? ?Social History  ? ?Occupational History  ? Occupation: Ship broker  ? Occupation: gradutates in 2022  ?  Employer: [employer]  ?Tobacco Use  ? Smoking status: Never  ? Smokeless tobacco: Never  ?Substance and Sexual Activity  ? Alcohol use: Yes  ? Drug use: No  ? Sexual activity: Yes  ?  Birth control/protection: Pill  ? ? ? ? ? ? ? ?

## 2021-07-26 ENCOUNTER — Encounter (HOSPITAL_BASED_OUTPATIENT_CLINIC_OR_DEPARTMENT_OTHER): Payer: Self-pay | Admitting: Orthopaedic Surgery

## 2021-07-26 ENCOUNTER — Other Ambulatory Visit: Payer: Self-pay

## 2021-07-27 NOTE — Progress Notes (Signed)

## 2021-08-06 ENCOUNTER — Other Ambulatory Visit: Payer: Self-pay

## 2021-08-08 ENCOUNTER — Encounter (HOSPITAL_BASED_OUTPATIENT_CLINIC_OR_DEPARTMENT_OTHER): Payer: Self-pay | Admitting: Orthopaedic Surgery

## 2021-08-08 ENCOUNTER — Encounter (HOSPITAL_BASED_OUTPATIENT_CLINIC_OR_DEPARTMENT_OTHER)
Admission: RE | Disposition: A | Discharge status: Home / Self Care | Payer: Self-pay | Source: Home / Self Care | Attending: Orthopaedic Surgery

## 2021-08-08 ENCOUNTER — Other Ambulatory Visit: Payer: Self-pay

## 2021-08-08 ENCOUNTER — Encounter: Payer: Self-pay | Admitting: Orthopaedic Surgery

## 2021-08-08 ENCOUNTER — Ambulatory Visit (HOSPITAL_BASED_OUTPATIENT_CLINIC_OR_DEPARTMENT_OTHER)
Admission: RE | Admit: 2021-08-08 | Discharge: 2021-08-08 | Disposition: A | Discharge status: Home / Self Care | Payer: PRIVATE HEALTH INSURANCE | Attending: Orthopaedic Surgery | Admitting: Orthopaedic Surgery

## 2021-08-08 ENCOUNTER — Ambulatory Visit (HOSPITAL_BASED_OUTPATIENT_CLINIC_OR_DEPARTMENT_OTHER): Payer: PRIVATE HEALTH INSURANCE | Admitting: Anesthesiology

## 2021-08-08 DIAGNOSIS — Z01818 Encounter for other preprocedural examination: Secondary | ICD-10-CM

## 2021-08-08 DIAGNOSIS — G8929 Other chronic pain: Secondary | ICD-10-CM | POA: Diagnosis not present

## 2021-08-08 DIAGNOSIS — S83241A Other tear of medial meniscus, current injury, right knee, initial encounter: Secondary | ICD-10-CM

## 2021-08-08 DIAGNOSIS — M25861 Other specified joint disorders, right knee: Secondary | ICD-10-CM

## 2021-08-08 DIAGNOSIS — S83281A Other tear of lateral meniscus, current injury, right knee, initial encounter: Secondary | ICD-10-CM | POA: Diagnosis not present

## 2021-08-08 DIAGNOSIS — X58XXXA Exposure to other specified factors, initial encounter: Secondary | ICD-10-CM | POA: Diagnosis not present

## 2021-08-08 DIAGNOSIS — M222X1 Patellofemoral disorders, right knee: Secondary | ICD-10-CM | POA: Diagnosis not present

## 2021-08-08 DIAGNOSIS — M6751 Plica syndrome, right knee: Secondary | ICD-10-CM

## 2021-08-08 HISTORY — PX: KNEE ARTHROSCOPY WITH MEDIAL MENISECTOMY: SHX5651

## 2021-08-08 LAB — POCT PREGNANCY, URINE: Preg Test, Ur: NEGATIVE

## 2021-08-08 SURGERY — ARTHROSCOPY, KNEE, WITH MEDIAL MENISCECTOMY
Anesthesia: General | Site: Knee | Laterality: Right

## 2021-08-08 MED ORDER — TRAMADOL HCL 50 MG PO TABS: 50.0000 mg | ORAL_TABLET | Freq: Two times a day (BID) | ORAL | 0 refills | Status: DC | PRN

## 2021-08-08 MED ORDER — MIDAZOLAM HCL 5 MG/5ML IJ SOLN
INTRAMUSCULAR | Status: DC | PRN
  Administered 2021-08-08: 2 mg via INTRAVENOUS

## 2021-08-08 MED ORDER — LACTATED RINGERS IV SOLN: INTRAVENOUS | Status: DC

## 2021-08-08 MED ORDER — CEFAZOLIN SODIUM-DEXTROSE 2-4 GM/100ML-% IV SOLN
2.0000 g | INTRAVENOUS | Status: AC
  Administered 2021-08-08: 2 g via INTRAVENOUS

## 2021-08-08 MED ORDER — DEXAMETHASONE SODIUM PHOSPHATE 10 MG/ML IJ SOLN
INTRAMUSCULAR | Status: AC
  Filled 2021-08-08: qty 1

## 2021-08-08 MED ORDER — OXYCODONE HCL 5 MG PO TABS
ORAL_TABLET | ORAL | Status: AC
  Filled 2021-08-08: qty 1

## 2021-08-08 MED ORDER — PROPOFOL 10 MG/ML IV BOLUS
INTRAVENOUS | Status: DC | PRN
  Administered 2021-08-08: 200 mg via INTRAVENOUS

## 2021-08-08 MED ORDER — EPHEDRINE 5 MG/ML INJ
INTRAVENOUS | Status: AC
  Filled 2021-08-08: qty 5

## 2021-08-08 MED ORDER — CEFAZOLIN SODIUM-DEXTROSE 2-4 GM/100ML-% IV SOLN
INTRAVENOUS | Status: AC
  Filled 2021-08-08: qty 100

## 2021-08-08 MED ORDER — DEXAMETHASONE SODIUM PHOSPHATE 4 MG/ML IJ SOLN
INTRAMUSCULAR | Status: DC | PRN
  Administered 2021-08-08: 10 mg via INTRAVENOUS

## 2021-08-08 MED ORDER — OXYCODONE HCL 5 MG PO TABS
5.0000 mg | ORAL_TABLET | Freq: Once | ORAL | Status: AC | PRN
  Administered 2021-08-08: 5 mg via ORAL

## 2021-08-08 MED ORDER — FENTANYL CITRATE (PF) 100 MCG/2ML IJ SOLN
INTRAMUSCULAR | Status: AC
  Filled 2021-08-08: qty 2

## 2021-08-08 MED ORDER — LIDOCAINE 2% (20 MG/ML) 5 ML SYRINGE
INTRAMUSCULAR | Status: AC
  Filled 2021-08-08: qty 5

## 2021-08-08 MED ORDER — SODIUM CHLORIDE 0.9 % IR SOLN
Status: DC | PRN
  Administered 2021-08-08: 6000 mL

## 2021-08-08 MED ORDER — MEPERIDINE HCL 25 MG/ML IJ SOLN: 6.2500 mg | INTRAMUSCULAR | Status: DC | PRN

## 2021-08-08 MED ORDER — AMISULPRIDE (ANTIEMETIC) 5 MG/2ML IV SOLN: 10.0000 mg | Freq: Once | INTRAVENOUS | Status: DC | PRN

## 2021-08-08 MED ORDER — BUPIVACAINE HCL (PF) 0.25 % IJ SOLN
INTRAMUSCULAR | Status: DC | PRN
  Administered 2021-08-08: 20 mL

## 2021-08-08 MED ORDER — BUPIVACAINE HCL (PF) 0.25 % IJ SOLN
INTRAMUSCULAR | Status: AC
  Filled 2021-08-08: qty 30

## 2021-08-08 MED ORDER — PHENYLEPHRINE 80 MCG/ML (10ML) SYRINGE FOR IV PUSH (FOR BLOOD PRESSURE SUPPORT)
PREFILLED_SYRINGE | INTRAVENOUS | Status: AC
  Filled 2021-08-08: qty 10

## 2021-08-08 MED ORDER — LIDOCAINE HCL (CARDIAC) PF 100 MG/5ML IV SOSY
PREFILLED_SYRINGE | INTRAVENOUS | Status: DC | PRN
  Administered 2021-08-08: 80 mg via INTRAVENOUS

## 2021-08-08 MED ORDER — HYDROMORPHONE HCL 1 MG/ML IJ SOLN
INTRAMUSCULAR | Status: AC
  Filled 2021-08-08: qty 0.5

## 2021-08-08 MED ORDER — ONDANSETRON HCL 4 MG/2ML IJ SOLN
INTRAMUSCULAR | Status: DC | PRN
  Administered 2021-08-08: 4 mg via INTRAVENOUS

## 2021-08-08 MED ORDER — HYDROMORPHONE HCL 1 MG/ML IJ SOLN
0.2500 mg | INTRAMUSCULAR | Status: DC | PRN
  Administered 2021-08-08: 0.5 mg via INTRAVENOUS

## 2021-08-08 MED ORDER — ONDANSETRON HCL 4 MG/2ML IJ SOLN
INTRAMUSCULAR | Status: AC
  Filled 2021-08-08: qty 2

## 2021-08-08 MED ORDER — OXYCODONE HCL 5 MG/5ML PO SOLN: 5.0000 mg | Freq: Once | ORAL | Status: AC | PRN

## 2021-08-08 MED ORDER — FENTANYL CITRATE (PF) 100 MCG/2ML IJ SOLN
INTRAMUSCULAR | Status: DC | PRN
  Administered 2021-08-08: 100 ug via INTRAVENOUS
  Administered 2021-08-08 (×2): 50 ug via INTRAVENOUS

## 2021-08-08 MED ORDER — MIDAZOLAM HCL 2 MG/2ML IJ SOLN
INTRAMUSCULAR | Status: AC
  Filled 2021-08-08: qty 2

## 2021-08-08 MED ORDER — HYDROCODONE-ACETAMINOPHEN 7.5-325 MG PO TABS: 1.0000 | ORAL_TABLET | Freq: Two times a day (BID) | ORAL | 0 refills | Status: DC | PRN

## 2021-08-08 MED ORDER — PROMETHAZINE HCL 25 MG/ML IJ SOLN: 6.2500 mg | INTRAMUSCULAR | Status: DC | PRN

## 2021-08-08 MED ORDER — SUCCINYLCHOLINE CHLORIDE 200 MG/10ML IV SOSY
PREFILLED_SYRINGE | INTRAVENOUS | Status: AC
  Filled 2021-08-08: qty 10

## 2021-08-08 SURGICAL SUPPLY — 37 items
BANDAGE ESMARK 6X9 LF (GAUZE/BANDAGES/DRESSINGS) IMPLANT
BLADE EXCALIBUR 4.0X13 (MISCELLANEOUS) ×2 IMPLANT
BLADE SHAVER TORPEDO 4X13 (MISCELLANEOUS) ×1 IMPLANT
BNDG CMPR 9X6 STRL LF SNTH (GAUZE/BANDAGES/DRESSINGS)
BNDG ELASTIC 6X5.8 VLCR STR LF (GAUZE/BANDAGES/DRESSINGS) ×6 IMPLANT
BNDG ESMARK 6X9 LF (GAUZE/BANDAGES/DRESSINGS)
BURR OVAL 8 FLU 4.0X13 (MISCELLANEOUS) IMPLANT
COOLER ICEMAN CLASSIC (MISCELLANEOUS) ×3 IMPLANT
CUFF TOURN SGL QUICK 34 (TOURNIQUET CUFF) ×2
CUFF TRNQT CYL 34X4.125X (TOURNIQUET CUFF) ×2 IMPLANT
CUTTER BONE 4.0MM X 13CM (MISCELLANEOUS) IMPLANT
DISSECTOR 3.5MM X 13CM CVD (MISCELLANEOUS) IMPLANT
DRAPE ARTHROSCOPY W/POUCH 90 (DRAPES) ×3 IMPLANT
DRAPE IMP U-DRAPE 54X76 (DRAPES) ×3 IMPLANT
DRAPE U-SHAPE 47X51 STRL (DRAPES) ×3 IMPLANT
DURAPREP 26ML APPLICATOR (WOUND CARE) ×5 IMPLANT
ELECT MENISCUS 165MM 90D (ELECTRODE) ×1 IMPLANT
EXCALIBUR 3.8MM X 13CM (MISCELLANEOUS) IMPLANT
GAUZE SPONGE 4X4 12PLY STRL (GAUZE/BANDAGES/DRESSINGS) ×3 IMPLANT
GAUZE XEROFORM 1X8 LF (GAUZE/BANDAGES/DRESSINGS) ×3 IMPLANT
GLOVE ECLIPSE 7.0 STRL STRAW (GLOVE) ×6 IMPLANT
GLOVE INDICATOR 7.0 STRL GRN (GLOVE) ×6 IMPLANT
GLOVE INDICATOR 7.5 STRL GRN (GLOVE) ×3 IMPLANT
GLOVE SURG SYN 7.5  E (GLOVE) ×2
GLOVE SURG SYN 7.5 E (GLOVE) ×1 IMPLANT
GLOVE SURG SYN 7.5 PF PI (GLOVE) ×2 IMPLANT
GOWN STRL REIN XL XLG (GOWN DISPOSABLE) ×6 IMPLANT
GOWN STRL REUS W/ TWL LRG LVL3 (GOWN DISPOSABLE) ×2 IMPLANT
GOWN STRL REUS W/TWL LRG LVL3 (GOWN DISPOSABLE) ×2
MANIFOLD NEPTUNE II (INSTRUMENTS) ×3 IMPLANT
PACK ARTHROSCOPY DSU (CUSTOM PROCEDURE TRAY) ×3 IMPLANT
PACK BASIN DAY SURGERY FS (CUSTOM PROCEDURE TRAY) ×3 IMPLANT
PAD COLD SHLDR WRAP-ON (PAD) ×3 IMPLANT
SHEET MEDIUM DRAPE 40X70 STRL (DRAPES) ×3 IMPLANT
SUT ETHILON 3 0 PS 1 (SUTURE) ×3 IMPLANT
TOWEL GREEN STERILE FF (TOWEL DISPOSABLE) ×3 IMPLANT
TUBING ARTHROSCOPY IRRIG 16FT (MISCELLANEOUS) ×3 IMPLANT

## 2021-08-08 NOTE — Anesthesia Postprocedure Evaluation (Signed)
Anesthesia Post Note  Patient: Carla Wang  Procedure(s) Performed: RIGHT KNEE ARTHROSCOPY WITH PARTIAL MEDIAL MENISECTOMY (Right: Knee)     Patient location during evaluation: PACU Anesthesia Type: General Level of consciousness: awake and alert Pain management: pain level controlled Vital Signs Assessment: post-procedure vital signs reviewed and stable Respiratory status: spontaneous breathing, nonlabored ventilation, respiratory function stable and patient connected to nasal cannula oxygen Cardiovascular status: blood pressure returned to baseline and stable Postop Assessment: no apparent nausea or vomiting Anesthetic complications: no   No notable events documented.  Last Vitals:  Vitals:   08/08/21 1400 08/08/21 1418  BP: 111/74 124/87  Pulse: 69 77  Resp: 15 16  Temp:  36.8 C  SpO2: 98% 98%    Last Pain:  Vitals:   08/08/21 1430  TempSrc:   PainSc: [name]

## 2021-08-08 NOTE — Anesthesia Preprocedure Evaluation (Signed)
Anesthesia Evaluation  Patient identified by MRN, date of birth, ID band Patient awake    Reviewed: Allergy & Precautions, NPO status , Patient's Chart, lab work & pertinent test results  Airway Mallampati: II  TM Distance: >3 FB Neck ROM: Full    Dental no notable dental hx.    Pulmonary neg pulmonary ROS,    Pulmonary exam normal breath sounds clear to auscultation       Cardiovascular negative cardio ROS Normal cardiovascular exam Rhythm:Regular Rate:Normal     Neuro/Psych negative neurological ROS  negative psych ROS   GI/Hepatic negative GI ROS, Neg liver ROS,   Endo/Other  negative endocrine ROS  Renal/GU negative Renal ROS  negative genitourinary   Musculoskeletal negative musculoskeletal ROS (+)   Abdominal   Peds negative pediatric ROS (+)  Hematology negative hematology ROS (+)   Anesthesia Other Findings   Reproductive/Obstetrics negative OB ROS                             Anesthesia Physical Anesthesia Plan  ASA: 1  Anesthesia Plan: General   Post-op Pain Management: Minimal or no pain anticipated   Induction: Intravenous  PONV Risk Score and Plan: 3 and Ondansetron, Dexamethasone, Midazolam and Treatment may vary due to age or medical condition  Airway Management Planned: LMA  Additional Equipment:   Intra-op Plan:   Post-operative Plan: Extubation in OR  Informed Consent: I have reviewed the patients History and Physical, chart, labs and discussed the procedure including the risks, benefits and alternatives for the proposed anesthesia with the patient or authorized representative who has indicated his/her understanding and acceptance.     Dental advisory given  Plan Discussed with: CRNA  Anesthesia Plan Comments:         Anesthesia Quick Evaluation

## 2021-08-08 NOTE — Discharge Instructions (Addendum)

## 2021-08-08 NOTE — H&P (Signed)
PREOPERATIVE H&P  Chief Complaint: right knee medial meniscal tear  HPI: Carla Wang is a 21 y.o. female who presents for surgical treatment of right knee medial meniscal tear.  She denies any changes in medical history.  Past Medical History:  Diagnosis Date   Allergy    Past Surgical History:  Procedure Laterality Date   HAND SURGERY     INGUINAL HERNIA REPAIR     Social History   Socioeconomic History   Marital status: Single    Spouse name: Not on file   Number of children: Not on file   Years of education: Not on file   Highest education level: Not on file  Occupational History   Occupation: Ship broker   Occupation: gradutates in 2022    Employer: [employer]  Tobacco Use   Smoking status: Never   Smokeless tobacco: Never  Vaping Use   Vaping Use: Never used  Substance and Sexual Activity   Alcohol use: Yes    Comment: occas   Drug use: No   Sexual activity: Yes    Birth control/protection: Pill  Other Topics Concern   Not on file  Social History Narrative   S   Social Determinants of Health   Financial Resource Strain: Not on file  Food Insecurity: Not on file  Transportation Needs: Not on file  Physical Activity: Not on file  Stress: Not on file  Social Connections: Not on file   Family History  Problem Relation Age of Onset   Diabetes Mother    Healthy Brother    Diabetes Maternal Aunt    Diabetes Maternal Grandmother    Prostate cancer Paternal Grandfather    Heart disease Neg Hx    Hyperlipidemia Neg Hx    Hypertension Neg Hx    No Known Allergies Prior to Admission medications   Medication Sig Start Date End Date Taking? Authorizing Provider  fexofenadine (ALLEGRA ALLERGY) 180 MG tablet Take 1 tablet (180 mg total) by mouth daily. 04/20/19  Yes Leamon Arnt, MD  norethindrone-ethinyl estradiol-iron (ESTROSTEP FE,TILIA FE,TRI-LEGEST FE) 1-20/1-30/1-35 MG-MCG tablet Take 1 tablet by mouth daily.   Yes [provider]   fluticasone (FLONASE) 50 MCG/ACT nasal spray INSTILL 2 SPRAYS IN EACH NOSTRIL ONCE DAILY 01/22/21   Leamon Arnt, MD  ibuprofen (ADVIL) 600 MG tablet Take 1 tablet (600 mg total) by mouth every 8 (eight) hours as needed (take after eating). 12/07/20   Jeanie Sewer, NP  montelukast (SINGULAIR) 10 MG tablet Take 1 tablet (10 mg total) by mouth at bedtime. 12/01/18   Briscoe Deutscher, DO     Positive ROS: All other systems have been reviewed and were otherwise negative with the exception of those mentioned in the HPI and as above.  Physical Exam: General: Alert, no acute distress Cardiovascular: No pedal edema Respiratory: No cyanosis, no use of accessory musculature GI: abdomen soft Skin: No lesions in the area of chief complaint Neurologic: Sensation intact distally Psychiatric: Patient is competent for consent with normal mood and affect Lymphatic: no lymphedema  MUSCULOSKELETAL: exam stable  Assessment: right knee medial meniscal tear  Plan: Plan for Procedure(s): LEFT KNEE ARTHROSCOPY WITH PARTIAL MEDIAL MENISECTOMY  The risks benefits and alternatives were discussed with the patient including but not limited to the risks of nonoperative treatment, versus surgical intervention including infection, bleeding, nerve injury,  blood clots, cardiopulmonary complications, morbidity, mortality, among others, and they were willing to proceed.   Preoperative templating of the joint replacement  has been completed, documented, and submitted to the Operating Room personnel in order to optimize intra-operative equipment management.   Eduard Roux, MD 08/08/2021 9:55 AM

## 2021-08-08 NOTE — Transfer of Care (Signed)
Immediate Anesthesia Transfer of Care Note  Patient: Carla Wang  Procedure(s) Performed: RIGHT KNEE ARTHROSCOPY WITH PARTIAL MEDIAL MENISECTOMY (Right: Knee)  Patient Location: PACU  Anesthesia Type:General  Level of Consciousness: awake, alert , oriented, drowsy and patient cooperative  Airway & Oxygen Therapy: Patient Spontanous Breathing and Patient connected to face mask oxygen  Post-op Assessment: Report given to RN and Post -op Vital signs reviewed and stable  Post vital signs: Reviewed and stable  Last Vitals:  Vitals Value Taken Time  BP    Temp    Pulse    Resp    SpO2      Last Pain:  Vitals:   08/08/21 1024  TempSrc: Oral  PainSc: 0-No pain      Patients Stated Pain Goal: 5 (47/58/30 7460)  Complications: No notable events documented.

## 2021-08-08 NOTE — Anesthesia Procedure Notes (Signed)
Procedure Name: LMA Insertion Date/Time: 08/08/2021 12:19 PM Performed by: Willa Frater, CRNA Pre-anesthesia Checklist: Patient identified, Emergency Drugs available, Suction available and Patient being monitored Patient Re-evaluated:Patient Re-evaluated prior to induction Oxygen Delivery Method: Circle system utilized Preoxygenation: Pre-oxygenation with 100% oxygen Induction Type: IV induction Ventilation: Mask ventilation without difficulty LMA: LMA inserted LMA Size: 4.0 Number of attempts: 1 Airway Equipment and Method: Bite block Placement Confirmation: positive ETCO2 Tube secured with: Tape Dental Injury: Teeth and Oropharynx as per pre-operative assessment

## 2021-08-08 NOTE — Op Note (Signed)
   Surgery Date: 08/08/2021  PREOPERATIVE DIAGNOSES:  1. Right knee medial and lateral meniscus tear 2. Chronic right knee pain  POSTOPERATIVE DIAGNOSES:  Right medial meniscus anterior horn tear Normal lateral meniscus Patellofemoral maltracking and dysfunction, increased TT-TG distance Trochlear dysplasia Thickened medial plica  PROCEDURES PERFORMED:  1. Right knee arthroscopy with medial plica resection 2. Right knee arthroscopy with arthroscopic partial medial meniscectomy 3. Right knee arthroscopy with arthroscopic lateral release  SURGEON: N. Eduard Roux, M.D.  ASSIST: Ciro Backer [place], [place]; necessary for the timely completion of procedure and due to complexity of procedure.  ANESTHESIA:  general  FLUIDS: Per anesthesia record.   ESTIMATED BLOOD LOSS: minimal  DESCRIPTION OF PROCEDURE: Carla Wang is a 21 y.o.-year-old female with above mentioned conditions. Full discussion held regarding risks benefits alternatives and complications related surgical intervention. Conservative care options reviewed. All questions answered.  The patient was identified in the preoperative holding area and the operative extremity was marked. The patient was brought to the operating room and transferred to operating table in a supine position. Satisfactory general anesthesia was induced by anesthesiology.    Standard anterolateral, anteromedial arthroscopy portals were obtained. The anteromedial portal was obtained with a spinal needle for localization under direct visualization with subsequent diagnostic findings.   There was no evidence of chondromalacia throughout the knee.  Limited synovectomy was performed for visualization.  There was a small flap of the anterior horn of the medial meniscus that was displaced.  This was debrided back to stable margins with a shaver.  The posterior horn of the medial meniscus was thoroughly inspected and no tears were identified.  There was some  intrasubstance degeneration on the inferior surface of the posterior horn but no tears that rose to the superior surface.  The lateral meniscus was unremarkable.  The cruciates were unremarkable.  The knee was placed in extension and I found a thickened medial plica which was resected.  The femoral trochlea was mildly dysplastic.  She had significant lateral tracking to the patella and based on the increased TT-TG distance of 20 mm found on MRI a lateral release was performed from the superior border of the patella to the inferior border.  Gutters were checked for loose bodies.  Excess fluid was removed from the knee joint.  Incisions were closed with interrupted nylon sutures.  Sterile dressings were applied.  Patient tolerated procedure well had no immediate complications.  Suprapatellar pouch and gutters: no synovitis or debris. Patella chondral surface: Grade 0 Trochlear chondral surface: Grade 0 Patellofemoral tracking: lateral Medial meniscus: small tear anterior horn.  Medial femoral condyle weight bearing surface: Grade 0 Medial tibial plateau: Grade 0 Anterior cruciate ligament:stable Posterior cruciate ligament:stable Lateral meniscus: normal.   Lateral femoral condyle weight bearing surface: Grade 0 Lateral tibial plateau: Grade 0  DISPOSITION: The patient was awakened from general anesthetic, extubated, taken to the recovery room in medically stable condition, no apparent complications. The patient may be weightbearing as tolerated to the operative lower extremity.  Range of motion of right knee as tolerated.  She will follow-up next week in the office for suture removal and initiation of range of motion.  She will need a PSO brace and initiation of outpatient physical therapy at that time.  Azucena Cecil, MD [facility] 1:05 PM

## 2021-08-09 ENCOUNTER — Encounter (HOSPITAL_BASED_OUTPATIENT_CLINIC_OR_DEPARTMENT_OTHER): Payer: Self-pay | Admitting: Orthopaedic Surgery

## 2021-08-09 NOTE — Progress Notes (Signed)
Left message stating courtesy call and if any questions or concerns please call the doctors office.  

## 2021-08-15 ENCOUNTER — Ambulatory Visit (INDEPENDENT_AMBULATORY_CARE_PROVIDER_SITE_OTHER): Payer: PRIVATE HEALTH INSURANCE | Admitting: Physician Assistant

## 2021-08-15 ENCOUNTER — Encounter: Payer: Self-pay | Admitting: Orthopaedic Surgery

## 2021-08-15 DIAGNOSIS — Z9889 Other specified postprocedural states: Secondary | ICD-10-CM

## 2021-08-15 NOTE — Progress Notes (Signed)
   Post-Op Visit Note   Patient: Carla Wang           Date of Birth: 06-18-2000           MRN: [id number] Visit Date: 08/15/2021 PCP: Leamon Arnt, MD   Assessment & Plan:  Chief Complaint:  Chief Complaint  Patient presents with   Right Knee - Routine Post Op   Visit Diagnoses:  1. S/P arthroscopy of right knee     Plan: Patient is a pleasant 21 year old female who comes in today 1 week status post right knee arthroscopic debridement medial meniscus and lateral release, date of surgery 08/08/2021.  She has been doing well.  Minimal pain.  She has been ambulating primarily without crutches.  Examination of her right knee reveals fully healed surgical portals with nylon sutures in place.  No evidence of infection or cellulitis.  Calves are soft nontender.  She is neurovascular intact distally.  Today, sutures were removed and Steri-Strips applied.  Intraoperative pictures reviewed.  PSO brace provided.  I have sent in a referral for formal physical therapy as well.  She will follow-up with [place] in 5 weeks time for recheck.  Call with concerns or questions in the meantime.  Follow-Up Instructions: Return in about 5 weeks (around 09/19/2021).   Orders:  Orders Placed This Encounter  Procedures   Ambulatory referral to Physical Therapy   No orders of the defined types were placed in this encounter.   Imaging: No new imaging  PMFS History: Patient Active Problem List   Diagnosis Date Noted   Patellofemoral dysfunction of right knee 36/46/8032   Synovial plica of right knee 02/24/8249   Acute medial meniscus tear of right knee 05/30/2021   Right shoulder strain, initial encounter 12/07/2020   ETD (Eustachian tube dysfunction), bilateral 11/29/2020   Chronic allergic rhinitis 11/29/2019   Oral contraceptive use 11/29/2019   Acne vulgaris 10/21/2017   Seborrheic dermatitis 10/21/2017   Eczema 10/21/2017   Dysmenorrhea 10/20/2015   Scoliosis 09/08/2014   Past Medical  History:  Diagnosis Date   Allergy     Family History  Problem Relation Age of Onset   Diabetes Mother    Healthy Brother    Diabetes Maternal Aunt    Diabetes Maternal Grandmother    Prostate cancer Paternal Grandfather    Heart disease Neg Hx    Hyperlipidemia Neg Hx    Hypertension Neg Hx     Past Surgical History:  Procedure Laterality Date   HAND SURGERY     INGUINAL HERNIA REPAIR     KNEE ARTHROSCOPY WITH MEDIAL MENISECTOMY Right 08/08/2021   Procedure: RIGHT KNEE ARTHROSCOPY WITH PARTIAL MEDIAL MENISECTOMY;  Surgeon: Leandrew Koyanagi, MD;  Location: [facility];  Service: Orthopedics;  Laterality: Right;   Social History   Occupational History   Occupation: Ship broker   Occupation: gradutates in 2022    Employer: [employer]  Tobacco Use   Smoking status: Never   Smokeless tobacco: Never  Vaping Use   Vaping Use: Never used  Substance and Sexual Activity   Alcohol use: Yes    Comment: occas   Drug use: No   Sexual activity: Yes    Birth control/protection: Pill

## 2021-08-15 NOTE — Telephone Encounter (Signed)
Ok to remove with hygiene and while sleeping

## 2021-08-21 ENCOUNTER — Ambulatory Visit: Payer: PRIVATE HEALTH INSURANCE | Admitting: Physical Therapy

## 2021-08-21 ENCOUNTER — Encounter: Payer: Self-pay | Admitting: Physical Therapy

## 2021-08-21 DIAGNOSIS — R2689 Other abnormalities of gait and mobility: Secondary | ICD-10-CM | POA: Diagnosis not present

## 2021-08-21 DIAGNOSIS — M25561 Pain in right knee: Secondary | ICD-10-CM | POA: Diagnosis not present

## 2021-08-21 DIAGNOSIS — M6281 Muscle weakness (generalized): Secondary | ICD-10-CM

## 2021-08-21 NOTE — Therapy (Signed)
OUTPATIENT PHYSICAL THERAPY LOWER EXTREMITY EVALUATION   Patient Name: Carla Wang MRN: [id number] DOB:22-Mar-2000, 21 y.o., female Today's Date: 08/21/2021   PT End of Session - 08/21/21 1347     Visit Number 1    Number of Visits 12    Date for PT Re-Evaluation 10/16/21    PT Start Time 1347    PT Stop Time 1430    PT Time Calculation (min) 43 min    Activity Tolerance Patient tolerated treatment well    Behavior During Therapy [facility] for tasks assessed/performed             Past Medical History:  Diagnosis Date   Allergy    Past Surgical History:  Procedure Laterality Date   HAND SURGERY     INGUINAL HERNIA REPAIR     KNEE ARTHROSCOPY WITH MEDIAL MENISECTOMY Right 08/08/2021   Procedure: RIGHT KNEE ARTHROSCOPY WITH PARTIAL MEDIAL MENISECTOMY;  Surgeon: Leandrew Koyanagi, MD;  Location: [facility];  Service: Orthopedics;  Laterality: Right;   Patient Active Problem List   Diagnosis Date Noted   Patellofemoral dysfunction of right knee 30/16/0109   Synovial plica of right knee 32/35/5732   Acute medial meniscus tear of right knee 05/30/2021   Right shoulder strain, initial encounter 12/07/2020   ETD (Eustachian tube dysfunction), bilateral 11/29/2020   Chronic allergic rhinitis 11/29/2019   Oral contraceptive use 11/29/2019   Acne vulgaris 10/21/2017   Seborrheic dermatitis 10/21/2017   Eczema 10/21/2017   Dysmenorrhea 10/20/2015   Scoliosis 09/08/2014    PCP: Leamon Arnt, MD  REFERRING PROVIDER: Aundra Dubin, PA-C  REFERRING DIAG: [phone] (ICD-10-CM) - S/P arthroscopy of right knee  THERAPY DIAG:  Acute pain of right knee - Plan: PT plan of care cert/re-cert  Muscle weakness (generalized) - Plan: PT plan of care cert/re-cert  Other abnormalities of gait and mobility - Plan: PT plan of care cert/re-cert  Rationale for Evaluation and Treatment Rehabilitation  ONSET DATE: status post right knee arthroscopic debridement medial meniscus  and lateral release, date of surgery 08/08/2021  SUBJECTIVE:   SUBJECTIVE STATEMENT: Had knee injury in weight lifting class a few years ago. Was found to have meniscus tear and had elective surgery for this.   PAIN:  Are you having pain? Yes: NPRS scale: 1/10 Pain location: lateral anterior Rt knee Pain description: sharp Aggravating factors: bending her knee Relieving factors: straightening knee  PRECAUTIONS: None  WEIGHT BEARING RESTRICTIONS No  FALLS:  Has patient fallen in last 6 months? 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    OBJECTIVE:   DIAGNOSTIC FINDINGS: MRI confirmed mensicus tear  PATIENT SURVEYS:  FOTO 55% functional  COGNITION:  Overall cognitive status: Within functional limits for tasks assessed     SENSATION: WFL  EDEMA:  Moderate edema in Rt knee  MUSCLE LENGTH:  PALPATION: No significant TTP   LOWER EXTREMITY ROM:  AROM/PROM Right eval Left eval  Hip flexion    Hip extension    Hip abduction    Hip adduction    Hip internal rotation    Hip external rotation    Knee flexion 90/102 127/  Knee extension -5/-3 in supine 0/  Ankle dorsiflexion    Ankle plantarflexion    Ankle inversion    Ankle eversion     (Blank rows = not tested)  LOWER  EXTREMITY MMT:  MMT Right eval Left eval  Hip flexion 4+   Hip extension    Hip abduction 4+   Hip adduction    Hip internal rotation    Hip external rotation    Knee flexion 3   Knee extension 3-   Ankle dorsiflexion    Ankle plantarflexion    Ankle inversion    Ankle eversion     (Blank rows = not tested)  LOWER EXTREMITY SPECIAL TESTS:    FUNCTIONAL TESTS:  Eval Needs UE support for sit to stand   GAIT: Distance walked: 100 Assistive device utilized: none Level of assistance: independent  Comments: slower velocity, antalgic gait  on Rt    TODAY'S TREATMENT: 08/21/21 Demonstrated and performed HEP listed below   PATIENT EDUCATION:  Education details: HEP, PT plan of care Person educated: Patient Education method: Explanation, Demonstration, Verbal cues, and Handouts Education comprehension: verbalized understanding, returned demonstration, and needs further education   HOME EXERCISE PROGRAM: Access Code: [email or web address] URL: [email or web address] Date: 08/21/2021 Prepared by: Elsie Ra  Exercises - Supine Quad Set  - 2 x daily - 6 x weekly - 1 sets - 15-20 reps - 5 sec hold - Supine Hamstring Stretch with Strap  - 2 x daily - 6 x weekly - 3 sets - 30 hold - Supine Heel Slide with Strap  - 2 x daily - 6 x weekly - 1 sets - 15-20 reps - 5 hold - Supine Active Straight Leg Raise  - 2 x daily - 6 x weekly - 1-2 sets - 10 reps - Seated Long Arc Quad  - 2 x daily - 6 x weekly - 2-3 sets - 10 reps - 3 hold - Sit to Stand with Armchair  - 2 x daily - 6 x weekly - 1-2 sets - 10 reps   ASSESSMENT:  CLINICAL IMPRESSION: Patient referred to PT status post right knee arthroscopic debridement medial meniscus and lateral release, date of surgery 08/08/2021. Patient will benefit from skilled PT to address below impairments, limitations and improve overall function.  OBJECTIVE IMPAIRMENTS: decreased activity tolerance, difficulty walking, decreased balance, decreased endurance, decreased mobility, decreased ROM, decreased strength, impaired flexibility, impaired LE use, and pain.  ACTIVITY LIMITATIONS: bending, lifting, carry, locomotion, cleaning, community activity, driving, and or occupation  PERSONAL FACTORS:  none  REHAB POTENTIAL: Good  CLINICAL DECISION MAKING: Stable/uncomplicated  EVALUATION COMPLEXITY: Low    GOALS: Short term PT Goals Target date: 09/18/2021 Pt will be I and compliant with HEP. Baseline:  Goal status: New  Long term PT goals Target date: 10/16/2021 Pt will improve Lt knee  AROM 0-125 deg to improve functional mobility Baseline: Goal status: New Pt will improve  hip/knee strength to at least 5-/5 MMT to improve functional strength Baseline: Goal status: New Pt will improve FOTO to at least 73% functional to show improved function Baseline: Goal status: New Pt will reduce pain to overall less than 2-3/10 with usual activity and work activity. Baseline: Goal status: New Pt will be able to ambulate community distances at least 1000 ft WNL gait pattern without complaints Baseline: Goal status: New  PLAN: PT FREQUENCY: 1-2 times per week   PT DURATION: 4-8 weeks  PLANNED INTERVENTIONS (unless contraindicated): aquatic PT, Canalith repositioning, cryotherapy, Electrical stimulation, Iontophoresis with 4 mg/ml dexamethasome, Moist heat, traction, Ultrasound, gait training, Therapeutic exercise, balance training, neuromuscular re-education, patient/family education, prosthetic training, manual techniques, passive ROM, dry needling, taping, vasopnuematic device, vestibular, spinal manipulations, joint manipulations  PLAN  FOR NEXT SESSION: review HEP, work on quad activation and gaining normal ROM

## 2021-08-31 ENCOUNTER — Encounter: Payer: Self-pay | Admitting: Orthopaedic Surgery

## 2021-09-06 ENCOUNTER — Ambulatory Visit (INDEPENDENT_AMBULATORY_CARE_PROVIDER_SITE_OTHER): Payer: PRIVATE HEALTH INSURANCE

## 2021-09-06 ENCOUNTER — Encounter: Payer: Self-pay | Admitting: Rehabilitative and Restorative Service Providers"

## 2021-09-06 ENCOUNTER — Ambulatory Visit: Payer: PRIVATE HEALTH INSURANCE | Admitting: Rehabilitative and Restorative Service Providers"

## 2021-09-06 DIAGNOSIS — M25561 Pain in right knee: Secondary | ICD-10-CM | POA: Diagnosis not present

## 2021-09-06 DIAGNOSIS — M6281 Muscle weakness (generalized): Secondary | ICD-10-CM | POA: Diagnosis not present

## 2021-09-06 DIAGNOSIS — R2689 Other abnormalities of gait and mobility: Secondary | ICD-10-CM

## 2021-09-06 NOTE — Therapy (Signed)
[facility] [place], [place], [address] Phone: [phone]   Fax:  [phone]  Patient Details  Name: Carla Wang MRN: [id number] Date of Birth: Nov 23, 2000 Referring Provider:  Aundra Dubin, [place]  Encounter Date: 09/06/2021  PCP: Leamon Arnt, MD   REFERRING PROVIDER: Nathaniel Man   REFERRING DIAG: [phone] (ICD-10-CM) - S/P arthroscopy of right knee  Rationale for Evaluation and Treatment Rehabilitation   ONSET DATE: status post right knee arthroscopic debridement medial meniscus and lateral release, date of surgery 08/08/2021   SUBJECTIVE:    SUBJECTIVE STATEMENT: I started back to work last week; haven't been overdoing it.    PAIN:  Are you having pain? Yes: NPRS scale: 4/10 Pain location: lateral anterior Rt knee Pain description: sharp Aggravating factors: bending her knee Relieving factors: straightening knee   PRECAUTIONS: None   WEIGHT BEARING RESTRICTIONS No   FALLS:  Has patient fallen in last 6 months? 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      OBJECTIVE:    DIAGNOSTIC FINDINGS: MRI confirmed mensicus tear   PATIENT SURVEYS:  FOTO 55% functional   COGNITION:           Overall cognitive status: Within functional limits for tasks assessed                          SENSATION: WFL   EDEMA:  Moderate edema in Rt knee   MUSCLE LENGTH:   PALPATION: No significant TTP    LOWER EXTREMITY ROM:   AROM/PROM Right eval Left eval  Hip flexion      Hip extension      Hip abduction      Hip adduction      Hip internal rotation      Hip external rotation      Knee flexion 90/102 127/  Knee extension -5/-3 in supine 0/  Ankle dorsiflexion      Ankle plantarflexion      Ankle inversion      Ankle eversion       (Blank rows = not  tested)   LOWER EXTREMITY MMT:   MMT Right eval Left eval  Hip flexion 4+    Hip extension      Hip abduction 4+    Hip adduction      Hip internal rotation      Hip external rotation      Knee flexion 3    Knee extension 3-    Ankle dorsiflexion      Ankle plantarflexion      Ankle inversion      Ankle eversion       (Blank rows = not tested)   LOWER EXTREMITY SPECIAL TESTS:      FUNCTIONAL TESTS:  Eval Needs UE support for sit to stand     GAIT: Distance walked: 100 Assistive device utilized: none Level of assistance: independent  Comments: slower velocity, antalgic gait on Rt       TODAY'S TREATMENT:  [place] Adult PT Treatment:  DATE: 09/06/21 Therapeutic Exercise: Quad sets x 20 with 5 sec hold SLR x 20 SAQ x 20 SAQ iso with ankle pump x 20 2 step quad set x 15 with pause at each step Sidelying hip abdct/addct x 20 each Prone hip extension x 20  Prone Hamstring curl x 15 Supine Hamstring Stretch with Strap 2x30 sec Supine Heel Slide with Strap 3x5 sec  Seated Long Arc Quad x 10 Sit to Stand from EOB x 10 Standing L abdct/ext tap combo for R LE joint co-contraction x 20 at parallel bar Standing bil toe raise x 20 at parallel bar SLS R x 1 min with UE flex/ext x 1 min for further joint co-contraction All done to R LE and with concentration on proper muscle facilitation. PT cued patient with heel slide with strap and STS from EOB importance of hip/knee/ankle alignment to decrease torque at the knee and showed her how putting a pillow between her knees but still making sure visually  her alignment was good before performing exercise.   Manual Therapy: Patella mobs all directions while advising pt to continue to ice at home and increase frequency additionally x 2 x 10-15 min due to increased activity level and continued light swelling.    08/21/21 Demonstrated and performed HEP listed below     PATIENT  EDUCATION:  Education details: HEP, PT plan of care Person educated: Patient Education method: Explanation, Demonstration, Verbal cues, and Handouts Education comprehension: verbalized understanding, returned demonstration, and needs further education     HOME EXERCISE PROGRAM: Access Code: [email or web address] URL: [email or web address] Date: 08/21/2021 Prepared by: Elsie Ra   Exercises - Supine Quad Set  - 2 x daily - 6 x weekly - 1 sets - 15-20 reps - 5 sec hold - Supine Hamstring Stretch with Strap  - 2 x daily - 6 x weekly - 3 sets - 30 hold - Supine Heel Slide with Strap  - 2 x daily - 6 x weekly - 1 sets - 15-20 reps - 5 hold - Supine Active Straight Leg Raise  - 2 x daily - 6 x weekly - 1-2 sets - 10 reps - Seated Long Arc Quad  - 2 x daily - 6 x weekly - 2-3 sets - 10 reps - 3 hold - Sit to Stand with Armchair  - 2 x daily - 6 x weekly - 1-2 sets - 10 reps     ASSESSMENT:   CLINICAL IMPRESSION: Patient referred to PT status post right knee arthroscopic debridement medial meniscus and lateral release, date of surgery 08/08/2021. Pt tolerated first treatment without increased pain. She continues to exhibit quad weakness with extension lag with SLR; advised pt to discontinue SLR for now until can build up more strength in quad. Patient will benefit from skilled PT to address below impairments, limitations and improve overall function.   OBJECTIVE IMPAIRMENTS: decreased activity tolerance, difficulty walking, decreased balance, decreased endurance, decreased mobility, decreased ROM, decreased strength, impaired flexibility, impaired LE use, and pain.   ACTIVITY LIMITATIONS: bending, lifting, carry, locomotion, cleaning, community activity, driving, and or occupation   PERSONAL FACTORS:  none   REHAB POTENTIAL: Good   CLINICAL DECISION MAKING: Stable/uncomplicated   EVALUATION COMPLEXITY: Low       GOALS: Short term PT Goals Target date: 09/18/2021 Pt will be I and  compliant with HEP. Baseline:  Goal status: New   Long term PT goals Target date: 10/16/2021 Pt will improve Lt knee AROM 0-125 deg to improve functional mobility  Baseline: Goal status: New Pt will improve  hip/knee strength to at least 5-/5 MMT to improve functional strength Baseline: Goal status: New Pt will improve FOTO to at least 73% functional to show improved function Baseline: Goal status: New Pt will reduce pain to overall less than 2-3/10 with usual activity and work activity. Baseline: Goal status: New Pt will be able to ambulate community distances at least 1000 ft WNL gait pattern without complaints Baseline: Goal status: New   PLAN: PT FREQUENCY: 1-2 times per week    PT DURATION: 4-8 weeks   PLANNED INTERVENTIONS (unless contraindicated): aquatic PT, Canalith repositioning, cryotherapy, Electrical stimulation, Iontophoresis with 4 mg/ml dexamethasome, Moist heat, traction, Ultrasound, gait training, Therapeutic exercise, balance training, neuromuscular re-education, patient/family education, prosthetic training, manual techniques, passive ROM, dry needling, taping, vasopnuematic device, vestibular, spinal manipulations, joint manipulations   PLAN FOR NEXT SESSION:  work on quad activation and gaining normal ROM    America Brown, PT 09/06/2021, 4:06 PM  [facility] [address] [place], [place], [address] Phone: [phone]   Fax:  [phone]

## 2021-09-06 NOTE — Progress Notes (Signed)
Patient came in for a smaller hinged knee brace. Fit patient with a size medium and she was happy with it.

## 2021-09-11 ENCOUNTER — Telehealth (INDEPENDENT_AMBULATORY_CARE_PROVIDER_SITE_OTHER): Payer: PRIVATE HEALTH INSURANCE | Admitting: Family Medicine

## 2021-09-11 ENCOUNTER — Ambulatory Visit: Payer: PRIVATE HEALTH INSURANCE | Admitting: Rehabilitative and Restorative Service Providers"

## 2021-09-11 ENCOUNTER — Encounter: Payer: Self-pay | Admitting: Family Medicine

## 2021-09-11 ENCOUNTER — Encounter: Payer: Self-pay | Admitting: Rehabilitative and Restorative Service Providers"

## 2021-09-11 VITALS — Ht 64.0 in | Wt 158.0 lb

## 2021-09-11 DIAGNOSIS — R2689 Other abnormalities of gait and mobility: Secondary | ICD-10-CM | POA: Diagnosis not present

## 2021-09-11 DIAGNOSIS — M25561 Pain in right knee: Secondary | ICD-10-CM | POA: Diagnosis not present

## 2021-09-11 DIAGNOSIS — L03011 Cellulitis of right finger: Secondary | ICD-10-CM | POA: Diagnosis not present

## 2021-09-11 DIAGNOSIS — M6281 Muscle weakness (generalized): Secondary | ICD-10-CM

## 2021-09-11 MED ORDER — CEPHALEXIN 500 MG PO CAPS: 500.0000 mg | ORAL_CAPSULE | Freq: Two times a day (BID) | ORAL | 0 refills | Status: DC

## 2021-09-11 NOTE — Therapy (Signed)
OUTPATIENT PHYSICAL THERAPY TREATMENT NOTE   Patient Name: Carla Wang MRN: [id number] DOB:06/05/2000, 21 y.o., female Today's Date: 09/11/2021  PCP: Leamon Arnt MD REFERRING PROVIDER: Aundra Dubin, PA-C  END OF SESSION:   PT End of Session - 09/11/21 1610     Visit Number 3    Number of Visits 12    Date for PT Re-Evaluation 10/16/21    PT Start Time 1607    PT Stop Time 1636    PT Time Calculation (min) 39 min    Activity Tolerance Patient tolerated treatment well             Past Medical History:  Diagnosis Date   Allergy    Past Surgical History:  Procedure Laterality Date   HAND SURGERY     INGUINAL HERNIA REPAIR     KNEE ARTHROSCOPY WITH MEDIAL MENISECTOMY Right 08/08/2021   Procedure: RIGHT KNEE ARTHROSCOPY WITH PARTIAL MEDIAL MENISECTOMY;  Surgeon: Leandrew Koyanagi, MD;  Location: [place];  Service: Orthopedics;  Laterality: Right;   Patient Active Problem List   Diagnosis Date Noted   Patellofemoral dysfunction of right knee 37/12/6267   Synovial plica of right knee 48/54/6270   Acute medial meniscus tear of right knee 05/30/2021   Right shoulder strain, initial encounter 12/07/2020   ETD (Eustachian tube dysfunction), bilateral 11/29/2020   Chronic allergic rhinitis 11/29/2019   Oral contraceptive use 11/29/2019   Acne vulgaris 10/21/2017   Seborrheic dermatitis 10/21/2017   Eczema 10/21/2017   Dysmenorrhea 10/20/2015   Scoliosis 09/08/2014    REFERRING DIAG: Z98.890 (ICD-10-CM) - S/P arthroscopy of right knee  THERAPY DIAG:  Acute pain of right knee  Muscle weakness (generalized)  Other abnormalities of gait and mobility  Rationale for Evaluation and Treatment Rehabilitation  PRECAUTIONS: none  SUBJECTIVE:    SUBJECTIVE STATEMENT: Pt indicated feeling no specific complaints of pain upon arrival today.  Reported feeling 2/10 at worst since last visit.    PAIN:  NPRS scale: 2/10 Pain location: lateral  anterior Rt knee Pain description: sharp Aggravating factors: bending her knee Relieving factors: straightening knee   PRECAUTIONS: None   WEIGHT BEARING RESTRICTIONS No   FALLS:  Has patient fallen in last 6 months? 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      OBJECTIVE:    DIAGNOSTIC FINDINGS:  08/21/2021 review: MRI confirmed mensicus tear   PATIENT SURVEYS:  08/21/2021 FOTO 55% functional   COGNITION: 08/21/2021 Overall cognitive status: Within functional limits for tasks assessed                          SENSATION: 08/21/2021 [facility]   EDEMA:  08/21/2021 Moderate edema in Rt knee   MUSCLE LENGTH:   PALPATION: 08/21/2021 No significant TTP    LOWER EXTREMITY ROM:   AROM/PROM Right 08/21/2021 Left 08/21/2021  Hip flexion      Hip extension      Hip abduction      Hip adduction      Hip internal rotation      Hip external rotation      Knee flexion 90/102 127/  Knee extension -5/-3 in supine 0/  Ankle dorsiflexion      Ankle plantarflexion      Ankle inversion      Ankle  eversion       (Blank rows = not tested)   LOWER EXTREMITY MMT:   MMT Right 08/21/2021 Left 08/21/2021  Hip flexion 4+    Hip extension      Hip abduction 4+    Hip adduction      Hip internal rotation      Hip external rotation      Knee flexion 3    Knee extension 3-    Ankle dorsiflexion      Ankle plantarflexion      Ankle inversion      Ankle eversion       (Blank rows = not tested)   LOWER EXTREMITY SPECIAL TESTS:      FUNCTIONAL TESTS:  08/21/2021 Eval Needs UE support for sit to stand     GAIT: 08/21/2021  Distance walked: 100 Assistive device utilized: none Level of assistance: independent  Comments: slower velocity, antalgic gait on Rt       TODAY'S TREATMENT: 09/11/2021: Therex:  Recumbent bike lvl 3 6 mins full range  - seat 5  Leg press double 75 lbs x 15, single leg only 37 lbs x 15 bilateral  Seated SLR 2 x 10 Rt  Standing Step up on Rt leg 4 inch 2 x 10 - unable to do step down due to pain  Eccentric stand to sit 18 inch chair x 10   Neuro Re-ed:  Church pew anterior/posterior weight shift on foam feet together 2 mins  Tandem stance on foam 1 min x 1 bilateral  SLS c anterior/medial/lateral light touch reaching x 8 bilateral   09/06/2021 Therapeutic Exercise: Quad sets x 20 with 5 sec hold SLR x 20 SAQ x 20 SAQ iso with ankle pump x 20 2 step quad set x 15 with pause at each step Sidelying hip abdct/addct x 20 each Prone hip extension x 20  Prone Hamstring curl x 15 Supine Hamstring Stretch with Strap 2x30 sec Supine Heel Slide with Strap 3x5 sec  Seated Long Arc Quad x 10 Sit to Stand from EOB x 10 Standing L abdct/ext tap combo for R LE joint co-contraction x 20 at parallel bar Standing bil toe raise x 20 at parallel bar SLS R x 1 min with UE flex/ext x 1 min for further joint co-contraction All done to R LE and with concentration on proper muscle facilitation. PT cued patient with heel slide with strap and STS from EOB importance of hip/knee/ankle alignment to decrease torque at the knee and showed her how putting a pillow between her knees but still making sure visually  her alignment was good before performing exercise.   Manual Therapy: Patella mobs all directions while advising pt to continue to ice at home and increase frequency additionally x 2 x 10-15 min due to increased activity level and continued light swelling.    08/21/21 Demonstrated and performed HEP listed below     PATIENT EDUCATION:  09/11/2021 Education details:  HEP progression  Person educated: Patient Education method: Consulting civil engineer, Media planner, Verbal cues, and Handouts Education comprehension: verbalized understanding, returned demonstration, and needs further education     HOME EXERCISE PROGRAM: Access  Code: [email or web address] URL: [email or web address] Date: 09/11/2021 Prepared by: Scot Jun  Exercises - Supine Quad Set  - 1-2 x daily - 6 x weekly - 1 sets - 15-20 reps - 5 sec hold - Supine Hamstring Stretch with Strap  - 1-2 x daily - 6 x weekly - 3 sets - 30 hold - Supine  Heel Slide with Strap  - 1-2 x daily - 6 x weekly - 1 sets - 15-20 reps - 5 hold - Supine Active Straight Leg Raise  - 1-2 x daily - 6 x weekly - 1-2 sets - 10 reps - Seated Long Arc Quad  - 1-2 x daily - 6 x weekly - 2-3 sets - 10 reps - 3 hold - Sit to Stand with Armchair  - 1-2 x daily - 6 x weekly - 1-2 sets - 10 reps - Seated Straight Leg Heel Taps  - 1-2 x daily - 7 x weekly - 1-2 sets - 10 reps - Sidelying Hip Abduction  - 1-2 x daily - 7 x weekly - 1-2 sets - 10 reps   ASSESSMENT:   CLINICAL IMPRESSION:  Difficulty in WB activity, particularly c loading - stair/squat.  Continued progressive strengthening indicated at this time.    OBJECTIVE IMPAIRMENTS: decreased activity tolerance, difficulty walking, decreased balance, decreased endurance, decreased mobility, decreased ROM, decreased strength, impaired flexibility, impaired LE use, and pain.   ACTIVITY LIMITATIONS: bending, lifting, carry, locomotion, cleaning, community activity, driving, and or occupation   PERSONAL FACTORS:  none   REHAB POTENTIAL: Good   CLINICAL DECISION MAKING: Stable/uncomplicated   EVALUATION COMPLEXITY: Low       GOALS: Short term PT Goals Target date: 09/18/2021 Pt will be I and compliant with HEP. Baseline:  Goal status: on going - assessed 09/11/2021   Long term PT goals Target date: 10/16/2021 Pt will improve Lt knee AROM 0-125 deg to improve functional mobility Baseline: Goal status: New Pt will improve  hip/knee strength to at least 5-/5 MMT to improve functional strength Baseline: Goal status: New Pt will improve FOTO to at least 73% functional to show improved function Baseline: Goal status:  New Pt will reduce pain to overall less than 2-3/10 with usual activity and work activity. Baseline: Goal status: New Pt will be able to ambulate community distances at least 1000 ft WNL gait pattern without complaints Baseline: Goal status: New   PLAN: PT FREQUENCY: 1-2 times per week    PT DURATION: 4-8 weeks   PLANNED INTERVENTIONS (unless contraindicated): aquatic PT, Canalith repositioning, cryotherapy, Electrical stimulation, Iontophoresis with 4 mg/ml dexamethasome, Moist heat, traction, Ultrasound, gait training, Therapeutic exercise, balance training, neuromuscular re-education, patient/family education, prosthetic training, manual techniques, passive ROM, dry needling, taping, vasopnuematic device, vestibular, spinal manipulations, joint manipulations   PLAN FOR NEXT SESSION:  Progressive strengthening, compliant surface balance/control improvements.     Scot Jun, PT, DPT, OCS, ATC 09/11/21  3:57 PM

## 2021-09-11 NOTE — Progress Notes (Signed)
Virtual Visit via Video Note  Subjective  CC: nail bed trauma  I connected with Carla Wang on 09/11/21 at 11:00 AM EDT by a video enabled telemedicine application and verified that I am speaking with the correct person using two identifiers. Location patient: Home Location provider: [facility] [place], Office Persons participating in the virtual visit: [place], Leamon Arnt, MD Darlina Rumpf CMA  I discussed the limitations of evaluation and management by telemedicine and the availability of in person appointments. The patient expressed understanding and agreed to proceed. HPI: Carla Wang is a 21 y.o. female who was contacted today to address the problems listed above in the chief complaint. I have reviewed patient has acrylic nails and grab something inadvertently, middle fingernail right hand was ripped off.  This happened July 9.  Nailbed is tender and mildly swollen and describes some crusty drainage.  Using Neosporin without relief.  No active bleeding.  No pus. Assessment  1. Infected nailbed of finger, right      Plan  Nailbed trauma with possible infection of the: Hard to visualize completely on video visit I do see some redness and crusting.  Cover with oral antibiotics, Keflex 500 twice daily x7 days.  Nailbed care discussed.  Clean with soapy warm water.  Keep bandage.  Advil for pain as needed.  If not improving, follow-up in office. I discussed the assessment and treatment plan with the patient. The patient was provided an opportunity to ask questions and all were answered. The patient agreed with the plan and demonstrated an understanding of the instructions.   The patient was advised to call back or seek an in-person evaluation if the symptoms worsen or if the condition fails to improve as anticipated. Follow up: For complete physical 12/03/2021  Meds ordered this encounter  Medications   cephALEXin (KEFLEX) 500 MG  capsule    Sig: Take 1 capsule (500 mg total) by mouth 2 (two) times daily.    Dispense:  14 capsule    Refill:  0      I reviewed the patients updated PMH, FH, and SocHx.    Patient Active Problem List   Diagnosis Date Noted   Patellofemoral dysfunction of right knee 48/18/5631   Synovial plica of right knee 49/70/2637   Acute medial meniscus tear of right knee 05/30/2021   Right shoulder strain, initial encounter 12/07/2020   ETD (Eustachian tube dysfunction), bilateral 11/29/2020   Chronic allergic rhinitis 11/29/2019   Oral contraceptive use 11/29/2019   Acne vulgaris 10/21/2017   Seborrheic dermatitis 10/21/2017   Eczema 10/21/2017   Dysmenorrhea 10/20/2015   Scoliosis 09/08/2014   Current Meds  Medication Sig   cephALEXin (KEFLEX) 500 MG capsule Take 1 capsule (500 mg total) by mouth 2 (two) times daily.    Allergies: Patient has No Known Allergies. Family History: Patient family history includes Diabetes in her maternal aunt, maternal grandmother, and mother; Healthy in her brother; Prostate cancer in her paternal grandfather. Social History:  Patient  reports that she has never smoked. She has never used smokeless tobacco. She reports current alcohol use. She reports that she does not use drugs.  Review of Systems: Constitutional: Negative for fever malaise or anorexia Cardiovascular: negative for chest pain Respiratory: negative for SOB or persistent cough Gastrointestinal: negative for abdominal pain  OBJECTIVE Vitals: Ht '5\' 4"'$  (1.626 m)   Wt 158 lb (71.7 kg)   BMI 27.12 kg/m  General:  no acute distress , A&Ox3 Right third nailbed without nail, mildly swollen, difficult to see but some crusting present  Leamon Arnt, MD

## 2021-09-18 ENCOUNTER — Encounter: Payer: Self-pay | Admitting: Orthopaedic Surgery

## 2021-09-18 ENCOUNTER — Encounter: Payer: Self-pay | Admitting: Physical Therapy

## 2021-09-18 ENCOUNTER — Ambulatory Visit (INDEPENDENT_AMBULATORY_CARE_PROVIDER_SITE_OTHER): Payer: PRIVATE HEALTH INSURANCE | Admitting: Physical Therapy

## 2021-09-18 ENCOUNTER — Ambulatory Visit (INDEPENDENT_AMBULATORY_CARE_PROVIDER_SITE_OTHER): Payer: PRIVATE HEALTH INSURANCE | Admitting: Orthopaedic Surgery

## 2021-09-18 DIAGNOSIS — M6281 Muscle weakness (generalized): Secondary | ICD-10-CM

## 2021-09-18 DIAGNOSIS — R2689 Other abnormalities of gait and mobility: Secondary | ICD-10-CM

## 2021-09-18 DIAGNOSIS — M25861 Other specified joint disorders, right knee: Secondary | ICD-10-CM

## 2021-09-18 DIAGNOSIS — Z9889 Other specified postprocedural states: Secondary | ICD-10-CM

## 2021-09-18 DIAGNOSIS — M25561 Pain in right knee: Secondary | ICD-10-CM

## 2021-09-18 NOTE — Progress Notes (Signed)
   Post-Op Visit Note   Patient: Carla Wang           Date of Birth: 07/22/2000           MRN: [id number] Visit Date: 09/18/2021 PCP: Leamon Arnt, MD   Assessment & Plan:  Chief Complaint:  Chief Complaint  Patient presents with   Right Knee - Follow-up    Right knee arthroscopy 08/08/2021   Visit Diagnoses:  1. S/P arthroscopy of right knee   2. Patellofemoral dysfunction of right knee     Plan: Patient is 6 weeks status post right knee scope medial meniscal debridement lateral release and medial plica resection.  Has finished outpatient PT.  No real complaints today.  Examination of right knee shows fully healed surgical scars.  Trace effusion.  Range of motion is adequate.  Patella tracking is normal.  At this point she can continue to increase activity as tolerated.  She will use the brace during more intense activities but may begin to wean as she tolerates.  Overall very functional and has recovered well.  Follow-up as needed.  Follow-Up Instructions: No follow-ups on file.   Orders:  No orders of the defined types were placed in this encounter.  No orders of the defined types were placed in this encounter.   Imaging: No results found.  PMFS History: Patient Active Problem List   Diagnosis Date Noted   Patellofemoral dysfunction of right knee 14/97/0263   Synovial plica of right knee 78/58/8502   Acute medial meniscus tear of right knee 05/30/2021   Right shoulder strain, initial encounter 12/07/2020   ETD (Eustachian tube dysfunction), bilateral 11/29/2020   Chronic allergic rhinitis 11/29/2019   Oral contraceptive use 11/29/2019   Acne vulgaris 10/21/2017   Seborrheic dermatitis 10/21/2017   Eczema 10/21/2017   Dysmenorrhea 10/20/2015   Scoliosis 09/08/2014   Past Medical History:  Diagnosis Date   Allergy     Family History  Problem Relation Age of Onset   Diabetes Mother    Healthy Brother    Diabetes Maternal Aunt    Diabetes Maternal  Grandmother    Prostate cancer Paternal Grandfather    Heart disease Neg Hx    Hyperlipidemia Neg Hx    Hypertension Neg Hx     Past Surgical History:  Procedure Laterality Date   HAND SURGERY     INGUINAL HERNIA REPAIR     KNEE ARTHROSCOPY WITH MEDIAL MENISECTOMY Right 08/08/2021   Procedure: RIGHT KNEE ARTHROSCOPY WITH PARTIAL MEDIAL MENISECTOMY;  Surgeon: Leandrew Koyanagi, MD;  Location: [place];  Service: Orthopedics;  Laterality: Right;   Social History   Occupational History   Occupation: Ship broker   Occupation: gradutates in 2022    Employer: [employer]  Tobacco Use   Smoking status: Never   Smokeless tobacco: Never  Vaping Use   Vaping Use: Never used  Substance and Sexual Activity   Alcohol use: Yes    Comment: occas   Drug use: No   Sexual activity: Yes    Birth control/protection: Pill

## 2021-09-18 NOTE — Therapy (Addendum)
OUTPATIENT PHYSICAL THERAPY TREATMENT NOTE PHYSICAL THERAPY DISCHARGE SUMMARY  Visits from Start of Care: 4  Current functional level related to goals / functional outcomes: See below   Remaining deficits: See below   Education / Equipment: HEP  Plan:  Patient goals were mostly met. Patient is being discharged due to not returning since last visit. Elsie Ra, PT, DPT 11/08/21 1:32 PM       Patient Name: TAKERRA LUPINACCI MRN: [id number] DOB:[birth year], 21 y.o., female Today's Date: 09/18/2021  PCP: Leamon Arnt MD REFERRING PROVIDER: Aundra Dubin, PA-C  END OF SESSION:   PT End of Session - 09/18/21 1515     Visit Number 4    Number of Visits 12    Date for PT Re-Evaluation 10/16/21    PT Start Time 7048    PT Stop Time 1555    PT Time Calculation (min) 40 min    Activity Tolerance Patient tolerated treatment well             Past Medical History:  Diagnosis Date   Allergy    Past Surgical History:  Procedure Laterality Date   HAND SURGERY     INGUINAL HERNIA REPAIR     KNEE ARTHROSCOPY WITH MEDIAL MENISECTOMY Right 08/08/2021   Procedure: RIGHT KNEE ARTHROSCOPY WITH PARTIAL MEDIAL MENISECTOMY;  Surgeon: Leandrew Koyanagi, MD;  Location: [place];  Service: Orthopedics;  Laterality: Right;   Patient Active Problem List   Diagnosis Date Noted   Patellofemoral dysfunction of right knee 88/91/6945   Synovial plica of right knee 03/88/8280   Acute medial meniscus tear of right knee 05/30/2021   Right shoulder strain, initial encounter 12/07/2020   ETD (Eustachian tube dysfunction), bilateral 11/29/2020   Chronic allergic rhinitis 11/29/2019   Oral contraceptive use 11/29/2019   Acne vulgaris 10/21/2017   Seborrheic dermatitis 10/21/2017   Eczema 10/21/2017   Dysmenorrhea 10/20/2015   Scoliosis 09/08/2014    REFERRING DIAG: Z98.890 (ICD-10-CM) - S/P arthroscopy of right knee  THERAPY DIAG:  Acute pain of right knee  Muscle  weakness (generalized)  Other abnormalities of gait and mobility  Rationale for Evaluation and Treatment Rehabilitation  PRECAUTIONS: none  SUBJECTIVE:    SUBJECTIVE STATEMENT: Pt indicated feeling no specific complaints of pain upon arrival today.  Reported feeling 2/10 at worst since last visit.    PAIN:  NPRS scale: 2/10 Pain location: lateral anterior Rt knee Pain description: sharp Aggravating factors: bending her knee Relieving factors: straightening knee   PRECAUTIONS: None   WEIGHT BEARING RESTRICTIONS No   FALLS:  Has patient fallen in last 6 months? 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      OBJECTIVE:    DIAGNOSTIC FINDINGS:  08/21/2021 review: MRI confirmed mensicus tear   PATIENT SURVEYS:  08/21/2021 FOTO 55% functional   COGNITION: 08/21/2021 Overall cognitive status: Within functional limits for tasks assessed                          SENSATION: 08/21/2021 [facility]   EDEMA:  08/21/2021 Moderate edema in Rt knee   MUSCLE LENGTH:   PALPATION: 08/21/2021 No significant TTP    LOWER EXTREMITY ROM:   AROM/PROM Right 08/21/2021 Left 09/18/2021 Right 09/18/21  Hip flexion       Hip extension  Hip abduction       Hip adduction       Hip internal rotation       Hip external rotation       Knee flexion 90/102 120/125 135/  Knee extension -5/-3 in supine 0 0  Ankle dorsiflexion       Ankle plantarflexion       Ankle inversion       Ankle eversion        (Blank rows = not tested)   LOWER EXTREMITY MMT:   MMT tested in sitting Right 08/21/2021 Right 09/18/2021  Hip flexion 4+ 5   Hip extension      Hip abduction 4+ 5   Hip adduction      Hip internal rotation      Hip external rotation      Knee extension 3 21# on Right (78% of left)  26.7# on left  Knee flexion 3-  5  Ankle dorsiflexion       Ankle plantarflexion      Ankle inversion      Ankle eversion       (Blank rows = not tested)   LOWER EXTREMITY SPECIAL TESTS:      FUNCTIONAL TESTS:  08/21/2021 Eval Needs UE support for sit to stand     GAIT: 08/21/2021  Distance walked: 100 Assistive device utilized: none Level of assistance: independent  Comments: slower velocity, antalgic gait on Rt  09/19/21 Gait WFL       TODAY'S TREATMENT: 09/18/2021: Therex:  Sci fit bike lvl 5 6 mins   Leg press double 75 lbs x 15, single leg only 37 lbs x 15 bilateral  Seated SLR 2 x 10 Rt  Standing Step up on Rt leg 4 inch 2 x 10 - unable to do step down due to pain  Eccentric stand to sit 18 inch chair x 10   Neuro Re-ed:  Church pew anterior/posterior weight shift on foam feet together 2 mins  Tandem stance on foam 1 min x 1 bilateral  SLS c anterior/medial/lateral light touch reaching x 8 bilateral  09/11/2021: Therex:  Recumbent bike lvl 3 6 mins full range - seat 5  Leg press double 75 lbs x 15, single leg only 37 lbs x 15 bilateral  Seated SLR 2 x 10 Rt  Standing Step up on Rt leg 4 inch 2 x 10 - unable to do step down due to pain  Eccentric stand to sit 18 inch chair x 10   Neuro Re-ed:  Church pew anterior/posterior weight shift on foam feet together 2 mins  Tandem stance on foam 1 min x 1 bilateral  SLS c anterior/medial/lateral light touch reaching x 8 bilateral    PATIENT EDUCATION:  09/11/2021 Education details:  HEP progression  Person educated: Patient Education method: Consulting civil engineer, Media planner, Verbal cues, and Handouts Education comprehension: verbalized understanding, returned demonstration, and needs further education     HOME EXERCISE PROGRAM: Access Code: [email or web address] URL: [email or web address] Date: 09/18/2021 Prepared by: Elsie Ra  Exercises - Supine Heel Slide with Strap  - 1-2 x daily - 6 x weekly - 1 sets - 15-20 reps - 5 hold - Supine Quadriceps Stretch with Strap  on Table  - 2 x daily - 6 x weekly - 3 reps - 30 hold - Sit to Stand with Armchair  - 1-2 x daily - 6 x weekly - 2-3 sets - 10 reps - Seated Straight Leg Heel Taps  - 1-2 x daily -  7 x weekly - 2-3 sets - 10 reps - Mini Squat with Counter Support  - 2 x daily - 6 x weekly - 1-2 sets - 10 reps - Lunge with Counter Support  - 2 x daily - 6 x weekly - 1-2 sets - 10 reps   ASSESSMENT:   CLINICAL IMPRESSION:  She is 6 weeks post op today and is doing reasonably well. Ambulation now [facility] without AD. She has full knee extension ROM and only missing slight flexion ROM compared to other knee. She has good overall hip and hamstring strength and has progressed to 78% strength in knee extension MMT using hand held dynamometer. She will follow up with MD after this so we will certainly get his opinion as well. At this point she could benefit from further strengthening or she can continue to work on this at home with independent program that I progressed today. I will leave that up to her to decide what she wants to do after she gets MD opinion.    OBJECTIVE IMPAIRMENTS: decreased activity tolerance, difficulty walking, decreased balance, decreased endurance, decreased mobility, decreased ROM, decreased strength, impaired flexibility, impaired LE use, and pain.   ACTIVITY LIMITATIONS: bending, lifting, carry, locomotion, cleaning, community activity, driving, and or occupation   PERSONAL FACTORS:  none   REHAB POTENTIAL: Good   CLINICAL DECISION MAKING: Stable/uncomplicated   EVALUATION COMPLEXITY: Low       GOALS: Short term PT Goals Target date: 09/18/2021 Pt will be I and compliant with HEP. Baseline:  Goal status: on going - assessed 09/11/2021   Long term PT goals Target date: 10/16/2021 Pt will improve Lt knee AROM 0-125 deg to improve functional mobility Baseline: Goal status: MET Pt will improve  hip/knee strength to at least 5-/5 MMT to improve functional strength Baseline: Goal status:  MET Pt will improve FOTO to at least 73% functional to show improved function Baseline: Goal status: New Pt will reduce pain to overall less than 2-3/10 with usual activity and work activity. Baseline: Goal status: MET Pt will be able to ambulate community distances at least 1000 ft WNL gait pattern without complaints Baseline: Goal status: MET  PLAN: PT FREQUENCY: 1-2 times per week    PT DURATION: 4-8 weeks   PLANNED INTERVENTIONS (unless contraindicated): aquatic PT, Canalith repositioning, cryotherapy, Electrical stimulation, Iontophoresis with 4 mg/ml dexamethasome, Moist heat, traction, Ultrasound, gait training, Therapeutic exercise, balance training, neuromuscular re-education, patient/family education, prosthetic training, manual techniques, passive ROM, dry needling, taping, vasopnuematic device, vestibular, spinal manipulations, joint manipulations   PLAN FOR NEXT SESSION:  She will set up more PT if she feels she needs to, if not will discharge after 30 days if we do not hear from her.   Elsie Ra, PT, DPT 09/18/21 3:37 PM

## 2021-10-04 ENCOUNTER — Encounter: Payer: Self-pay | Admitting: Orthopaedic Surgery

## 2021-11-09 ENCOUNTER — Telehealth: Payer: Self-pay | Admitting: Family Medicine

## 2021-11-09 NOTE — Telephone Encounter (Signed)
Pt is wanting to transfer her care from Dr. Jonni Sanger to [place]. Our office is closer to her home. Let me know and I can schedule this app. Pt @ [phone]

## 2021-11-13 ENCOUNTER — Ambulatory Visit (INDEPENDENT_AMBULATORY_CARE_PROVIDER_SITE_OTHER): Payer: PRIVATE HEALTH INSURANCE | Admitting: Physician Assistant

## 2021-11-13 DIAGNOSIS — Z9889 Other specified postprocedural states: Secondary | ICD-10-CM

## 2021-11-13 MED ORDER — DICLOFENAC SODIUM 75 MG PO TBEC: 75.0000 mg | DELAYED_RELEASE_TABLET | Freq: Two times a day (BID) | ORAL | 2 refills | Status: DC | PRN

## 2021-11-13 NOTE — Progress Notes (Signed)
Post-Op Visit Note   Patient: Carla Wang           Date of Birth: 03/01/2001           MRN: [id number] Visit Date: 11/13/2021 PCP: Leamon Arnt, MD   Assessment & Plan:  Chief Complaint:  Chief Complaint  Patient presents with   Right Knee - Pain   Visit Diagnoses:  1. S/P right knee arthroscopy     Plan: Patient is a pleasant 21 year old female who comes in today with right knee pain.  She is status post right knee arthroscopic debridement medial meniscus, medial plica excision and lateral release 08/08/2021.  She has been doing okay.  She has finished formal physical therapy but has recently started to wear her PSO brace again.  She notes that she is having soreness to the medial aspect of the knee but also giving way sensations with walking.  She denies any new injury or change in activity.  Examination of her right knee reveals no effusion.  Range of motion 0 to 120 degrees.  Mild tenderness along the medial retinaculum.  No patellar apprehension.  4 out of 5 strength with resisted straight leg raise.  She is neurovascular tact distally.  At this point, I believe she needs to continue wearing her PSO brace.  I would like to also repeat start her in physical therapy to regain more quadricep strengthening.  She will follow-up with [place] as needed.  Call with concerns or questions.  Follow-Up Instructions: Return if symptoms worsen or fail to improve.   Orders:  Orders Placed This Encounter  Procedures   Ambulatory referral to Physical Therapy   Meds ordered this encounter  Medications   diclofenac (VOLTAREN) 75 MG EC tablet    Sig: Take 1 tablet (75 mg total) by mouth 2 (two) times daily as needed.    Dispense:  60 tablet    Refill:  2    Imaging: No new imaging  PMFS History: Patient Active Problem List   Diagnosis Date Noted   Patellofemoral dysfunction of right knee 11/24/3005   Synovial plica of right knee 62/26/3335   Acute medial meniscus tear of right knee  05/30/2021   Right shoulder strain, initial encounter 12/07/2020   ETD (Eustachian tube dysfunction), bilateral 11/29/2020   Chronic allergic rhinitis 11/29/2019   Oral contraceptive use 11/29/2019   Acne vulgaris 10/21/2017   Seborrheic dermatitis 10/21/2017   Eczema 10/21/2017   Dysmenorrhea 10/20/2015   Scoliosis 09/08/2014   Past Medical History:  Diagnosis Date   Allergy     Family History  Problem Relation Age of Onset   Diabetes Mother    Healthy Brother    Diabetes Maternal Aunt    Diabetes Maternal Grandmother    Prostate cancer Paternal Grandfather    Heart disease Neg Hx    Hyperlipidemia Neg Hx    Hypertension Neg Hx     Past Surgical History:  Procedure Laterality Date   HAND SURGERY     INGUINAL HERNIA REPAIR     KNEE ARTHROSCOPY WITH MEDIAL MENISECTOMY Right 08/08/2021   Procedure: RIGHT KNEE ARTHROSCOPY WITH PARTIAL MEDIAL MENISECTOMY;  Surgeon: Leandrew Koyanagi, MD;  Location: [facility];  Service: Orthopedics;  Laterality: Right;   Social History   Occupational History   Occupation: Ship broker   Occupation: gradutates in 2022    Employer: [employer]  Tobacco Use   Smoking status: Never   Smokeless tobacco: Never  Vaping Use  Vaping Use: Never used  Substance and Sexual Activity   Alcohol use: Yes    Comment: occas   Drug use: No   Sexual activity: Yes    Birth control/protection: Pill

## 2021-11-21 ENCOUNTER — Encounter: Payer: Self-pay | Admitting: Physical Therapy

## 2021-11-21 ENCOUNTER — Ambulatory Visit: Payer: PRIVATE HEALTH INSURANCE | Admitting: Physical Therapy

## 2021-11-21 DIAGNOSIS — G8929 Other chronic pain: Secondary | ICD-10-CM

## 2021-11-21 DIAGNOSIS — M6281 Muscle weakness (generalized): Secondary | ICD-10-CM | POA: Diagnosis not present

## 2021-11-21 DIAGNOSIS — M25561 Pain in right knee: Secondary | ICD-10-CM | POA: Diagnosis not present

## 2021-11-21 NOTE — Therapy (Signed)
OUTPATIENT PHYSICAL THERAPY LOWER EXTREMITY EVALUATION   Patient Name: Carla Wang MRN: [id number] DOB:29-Mar-2000, 21 y.o., female Today's Date: 11/21/2021   PT End of Session - 11/21/21 1010     Visit Number 1    Number of Visits 9    Date for PT Re-Evaluation 01/16/22    Authorization Type [employer]    Authorization Time Period 11/21/21 to 01/16/22    Authorization - Number of Visits 56   60 visits/year, 4 used   PT Start Time 0932    PT Stop Time 1010    PT Time Calculation (min) 38 min    Activity Tolerance Patient tolerated treatment well    Behavior During Therapy WFL for tasks assessed/performed             Past Medical History:  Diagnosis Date   Allergy    Past Surgical History:  Procedure Laterality Date   HAND SURGERY     INGUINAL HERNIA REPAIR     KNEE ARTHROSCOPY WITH MEDIAL MENISECTOMY Right 08/08/2021   Procedure: RIGHT KNEE ARTHROSCOPY WITH PARTIAL MEDIAL MENISECTOMY;  Surgeon: Leandrew Koyanagi, MD;  Location: [facility];  Service: Orthopedics;  Laterality: Right;   Patient Active Problem List   Diagnosis Date Noted   Patellofemoral dysfunction of right knee 43/32/9518   Synovial plica of right knee 84/16/6063   Acute medial meniscus tear of right knee 05/30/2021   Right shoulder strain, initial encounter 12/07/2020   ETD (Eustachian tube dysfunction), bilateral 11/29/2020   Chronic allergic rhinitis 11/29/2019   Oral contraceptive use 11/29/2019   Acne vulgaris 10/21/2017   Seborrheic dermatitis 10/21/2017   Eczema 10/21/2017   Dysmenorrhea 10/20/2015   Scoliosis 09/08/2014    PCP: Billey Chang   REFERRING PROVIDER: Aundra Dubin, PA-C   REFERRING DIAG: [phone] (ICD-10-CM) - S/P right knee arthroscopy   THERAPY DIAG:  Chronic pain of right knee - Plan: PT plan of care cert/re-cert  Muscle weakness (generalized) - Plan: PT plan of care cert/re-cert  Rationale for Evaluation and Treatment Rehabilitation  ONSET DATE:  11/13/2021   SUBJECTIVE:   SUBJECTIVE STATEMENT: My knee is still giving me some trouble when I walk it will buckle on my completely, can be once a day or every other day. Stairs are OK but painful going down.No falls. I started going to the gym again but I stopped bc it was making my knee sore. Had been keeping up with HEP here maybe 1-2 times per week. Recently its been a lot more sore than it was before, medial side of kneecap.   PERTINENT HISTORY: Plan: Patient is a pleasant 21 year old female who comes in today with right knee pain.  She is status post right knee arthroscopic debridement medial meniscus, medial plica excision and lateral release 08/08/2021.  She has been doing okay.  She has finished formal physical therapy but has recently started to wear her PSO brace again.  She notes that she is having soreness to the medial aspect of the knee but also giving way sensations with walking.  She denies any new injury or change in activity.  Examination of her right knee reveals no effusion.  Range of motion 0 to 120 degrees.  Mild tenderness along the medial retinaculum.  No patellar apprehension.  4 out of 5 strength with resisted straight leg raise.  She is neurovascular tact distally.  At this point, I believe she needs to continue wearing her PSO brace.  I would like to also repeat  start her in physical therapy to regain more quadricep strengthening.  She will follow-up with [place] as needed.  Call with concerns or questions.    PAIN:  Are you having pain? Yes: NPRS scale: 5/10 Pain location: medial R knee Pain description: constant soreness  Aggravating factors: lots of walking through the day  Relieving factors: rest and ice  PRECAUTIONS: None  WEIGHT BEARING RESTRICTIONS No  FALLS:  Has patient fallen in last 6 months? 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   PLOF: Independent, Independent with basic ADLs, Independent with gait, and Independent with transfers  PATIENT GOALS get knee stronger    OBJECTIVE:   DIAGNOSTIC FINDINGS:   PATIENT SURVEYS:  FOTO 63  COGNITION:  Overall cognitive status: Within functional limits for tasks assessed     SENSATION: WFL  EDEMA:  Swelling present, has been icing   MUSCLE LENGTH: Quads mild limitation, hip flexors WNL, R HS moderate limitation/L HS mild limitation; R piriformis WNL/L piriformis mild limitation  POSTURE:   PALPATION: TTP medial R knee, no mm spasms or trigger points noted in quads or HS groups R LE; some edema noted around distal border of patella but this seems to be present L knee as well   LOWER EXTREMITY ROM:  Active ROM Right eval Left eval  Hip flexion    Hip extension    Hip abduction    Hip adduction    Hip internal rotation    Hip external rotation    Knee flexion WNL    Knee extension WNL    Ankle dorsiflexion    Ankle plantarflexion    Ankle inversion    Ankle eversion     (Blank rows = not tested)    LOWER EXTREMITY MMT:  MMT Right eval Left eval  Hip flexion 5 5  Hip extension 4+ 4+  Hip abduction 4+ 4+  Hip adduction    Hip internal rotation    Hip external rotation    Knee flexion 4 4+  Knee extension 4 4+  Ankle dorsiflexion 5 5  Ankle plantarflexion    Ankle inversion    Ankle eversion     (Blank rows = not tested)  LOWER EXTREMITY SPECIAL TESTS:    FUNCTIONAL TESTS:  SLR- 26 degree lag noted R LE   GAIT: Distance walked: in clinic distances  Assistive device utilized: None Level of assistance: Complete Independence Comments: generally WNL, does seem to favor R LE in general    TODAY'S TREATMENT: Nustep L5 x6 minutes BLEs only   Quad sets 1x15 5 second holds SAQs 1x10 3 second  holds Single leg bridges x10 B  Walking bridges x10  LAQs yellow TB 1x5 R LE 3 second holds     PATIENT EDUCATION:  Education details: exam findings, POC, HEP  Person educated: Patient Education method: Consulting civil engineer, Demonstration, and Handouts Education comprehension: verbalized understanding and returned demonstration   HOME EXERCISE PROGRAM:  Access Code: [email or web address] URL: [email or web address] Date: 11/21/2021 Prepared by: Deniece Ree  Exercises - Supine Quad Set  - 2 x daily - 7 x weekly - 1 sets - 15-20 reps - 5 hold - Supine Short Arc Quad  - 2 x daily - 7 x weekly - 1 sets -  10-15 reps - 3 hold - Single Leg Bridge  - 2 x daily - 7 x weekly - 1 sets - 10 reps - 1 hold - Bridge Walk Out  - 2 x daily - 7 x weekly - 1 sets - 10 reps - 1 hold - Sitting Knee Extension with Resistance  - 2 x daily - 7 x weekly - 1 sets - 10 reps - 3 hold  ASSESSMENT:  CLINICAL IMPRESSION: Patient is a 21 y.o. F who was seen today for physical therapy evaluation and treatment for ongoing PT care s/p R knee scope. Exam reveals significant R knee strength limitations as well as impaired functional mm flexibility. Gait was generally WNL today but she does have knee buckling on a regular basis. Unable to perform SLR without severe quad lag. Of note was here in therapy before but looks to have only had a few sessions before DC. Will benefit from skilled PT services to address functional impairments.    OBJECTIVE IMPAIRMENTS Abnormal gait, decreased knowledge of condition, decreased mobility, difficulty walking, decreased strength, increased edema, impaired flexibility, and pain.   ACTIVITY LIMITATIONS standing, squatting, stairs, transfers, and locomotion level  PARTICIPATION LIMITATIONS: shopping, community activity, occupation, and yard work  PERSONAL FACTORS Age, Behavior pattern, Fitness, Past/current experiences, and Time since onset of injury/illness/exacerbation are also affecting  patient's functional outcome.   REHAB POTENTIAL: Good  CLINICAL DECISION MAKING: Stable/uncomplicated  EVALUATION COMPLEXITY: Low   GOALS: Goals reviewed with patient? Yes  SHORT TERM GOALS: Target date: 12/19/2021  Will be compliant with appropriate progressive HEP  Baseline: Goal status: INITIAL  2.  Pain in R knee to be no more than 6/10 at worst  Baseline: 9/10 at worst at eval  Goal status: INITIAL  3.  Will be able to perform SLR without quad lag  Baseline:  Goal status: INITIAL  4.  Will report no incidents of R knee buckling within the past 2 weeks  Baseline:  Goal status: INITIAL    LONG TERM GOALS: Target date: 01/16/2022   MMT to be at least 4+/5 in RLE globally  Baseline:  Goal status: INITIAL  2.  Will be able to perform steps and walk unlimited distances without knee buckling or pain  Baseline:  Goal status: INITIAL  3.  Pain in R knee to be no more than 2/10 at worst  Baseline:  Goal status: INITIAL  4.  Will be independent in appropriate gym based exercise program to maintain functional gains and prevent recurrence of pain/functional difficulty  Baseline:  Goal status: INITIAL    PLAN: PT FREQUENCY: 1x/week  PT DURATION: 8 weeks  PLANNED INTERVENTIONS: Therapeutic exercises, Therapeutic activity, Neuromuscular re-education, Balance training, Gait training, Patient/Family education, Self Care, Joint mobilization, Stair training, Orthotic/Fit training, Dry Needling, Electrical stimulation, Cryotherapy, Moist heat, Taping, Ultrasound, Ionotophoresis '4mg'$ /ml Dexamethasone, Manual therapy, and Re-evaluation  PLAN FOR NEXT SESSION: R LE strengthening, focus on quad    Alyssandra Hulsebus U PT DPT PN2  11/21/2021, 10:50 AM

## 2021-11-26 ENCOUNTER — Encounter: Payer: Self-pay | Admitting: *Deleted

## 2021-11-28 ENCOUNTER — Ambulatory Visit: Payer: PRIVATE HEALTH INSURANCE | Admitting: Physical Therapy

## 2021-11-28 ENCOUNTER — Encounter: Payer: Self-pay | Admitting: Physical Therapy

## 2021-11-28 DIAGNOSIS — M6281 Muscle weakness (generalized): Secondary | ICD-10-CM

## 2021-11-28 DIAGNOSIS — M25561 Pain in right knee: Secondary | ICD-10-CM | POA: Diagnosis not present

## 2021-11-28 DIAGNOSIS — G8929 Other chronic pain: Secondary | ICD-10-CM | POA: Diagnosis not present

## 2021-11-28 DIAGNOSIS — R2689 Other abnormalities of gait and mobility: Secondary | ICD-10-CM | POA: Diagnosis not present

## 2021-11-28 NOTE — Therapy (Signed)
OUTPATIENT PHYSICAL THERAPY TREATMENT NOTE   Patient Name: Carla Wang MRN: [id number] DOB:May 06, 2000, 21 y.o., female Today's Date: 11/28/2021  END OF SESSION:   PT End of Session - 11/28/21 0842     Visit Number 2    Number of Visits 9    Date for PT Re-Evaluation 01/16/22    Authorization Type [employer]    Authorization Time Period 11/21/21 to 01/16/22    Authorization - Number of Visits 56   60 visits/year, 4 used   PT Start Time 0842    PT Stop Time 0927    PT Time Calculation (min) 45 min    Activity Tolerance Patient tolerated treatment well    Behavior During Therapy [facility] for tasks assessed/performed             Past Medical History:  Diagnosis Date   Allergy    Past Surgical History:  Procedure Laterality Date   HAND SURGERY     INGUINAL HERNIA REPAIR     KNEE ARTHROSCOPY WITH MEDIAL MENISECTOMY Right 08/08/2021   Procedure: RIGHT KNEE ARTHROSCOPY WITH PARTIAL MEDIAL MENISECTOMY;  Surgeon: Leandrew Koyanagi, MD;  Location: [facility];  Service: Orthopedics;  Laterality: Right;   Patient Active Problem List   Diagnosis Date Noted   Patellofemoral dysfunction of right knee 33/35/4562   Synovial plica of right knee 56/38/9373   Acute medial meniscus tear of right knee 05/30/2021   Right shoulder strain, initial encounter 12/07/2020   ETD (Eustachian tube dysfunction), bilateral 11/29/2020   Chronic allergic rhinitis 11/29/2019   Oral contraceptive use 11/29/2019   Acne vulgaris 10/21/2017   Seborrheic dermatitis 10/21/2017   Eczema 10/21/2017   Dysmenorrhea 10/20/2015   Scoliosis 09/08/2014     THERAPY DIAG:  Chronic pain of right knee  Muscle weakness (generalized)  Acute pain of right knee  Other abnormalities of gait and mobility   PCP: Billey Chang   REFERRING PROVIDER: Aundra Dubin, PA-C   REFERRING DIAG: [phone] (ICD-10-CM) - S/P right knee arthroscopy   Rationale for Evaluation and Treatment Rehabilitation  ONSET  DATE: 11/13/2021   SUBJECTIVE:   SUBJECTIVE STATEMENT: Knee is sore today; otherwise doing okay.  PERTINENT HISTORY: Plan: Patient is a pleasant 21 year old female who comes in today with right knee pain.  She is status post right knee arthroscopic debridement medial meniscus, medial plica excision and lateral release 08/08/2021.  She has been doing okay.  She has finished formal physical therapy but has recently started to wear her PSO brace again.  She notes that she is having soreness to the medial aspect of the knee but also giving way sensations with walking.  She denies any new injury or change in activity.  Examination of her right knee reveals no effusion.  Range of motion 0 to 120 degrees.  Mild tenderness along the medial retinaculum.  No patellar apprehension.  4 out of 5 strength with resisted straight leg raise.  She is neurovascular tact distally.  At this point, I believe she needs to continue wearing her PSO brace.  I would like to also repeat start her in physical therapy to regain more quadricep strengthening.  She will follow-up with [place] as needed.  Call with concerns or questions.    PAIN:  Are you having pain? Yes: NPRS scale: 3/10 Pain location: medial R knee Pain description: constant soreness  Aggravating factors: lots of walking through the day  Relieving factors: rest and ice  PRECAUTIONS: None  WEIGHT BEARING RESTRICTIONS  No  FALLS:  Has patient fallen in last 6 months? 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   PLOF: Independent, Independent with basic ADLs, Independent with gait, and Independent with transfers  PATIENT GOALS get knee stronger    OBJECTIVE:   PATIENT SURVEYS:  EVAL: FOTO 63  EDEMA:   Swelling present, has been icing   MUSCLE LENGTH: Quads mild limitation, hip flexors WNL, R HS moderate limitation/L HS mild limitation; R piriformis WNL/L piriformis mild limitation  PALPATION: TTP medial R knee, no mm spasms or trigger points noted in quads or HS groups R LE; some edema noted around distal border of patella but this seems to be present L knee as well   LOWER EXTREMITY ROM:  Active ROM Right eval Left eval  Hip flexion    Hip extension    Hip abduction    Hip adduction    Hip internal rotation    Hip external rotation    Knee flexion WNL    Knee extension WNL    Ankle dorsiflexion    Ankle plantarflexion    Ankle inversion    Ankle eversion     (Blank rows = not tested)    LOWER EXTREMITY MMT:  MMT Right eval Left eval  Hip flexion 5 5  Hip extension 4+ 4+  Hip abduction 4+ 4+  Hip adduction    Hip internal rotation    Hip external rotation    Knee flexion 4 4+  Knee extension 4 4+  Ankle dorsiflexion 5 5  Ankle plantarflexion    Ankle inversion    Ankle eversion     (Blank rows = not tested)  FUNCTIONAL TESTS:  SLR- 26 degree lag noted R LE   GAIT: Distance walked: in clinic distances  Assistive device utilized: None Level of assistance: Complete Independence Comments: generally WNL, does seem to favor R LE in general    TODAY'S TREATMENT: 11/28/21 TherEx: Recumbent Bike L4 x 8 min LAQs yellow TB x10 RLE 3 second holds Quad sets x 10 reps; working on heel pop -improved contraction with bil activation Rt SAQ x 10 reps; 5 sec hold RLE only bridge x 10 reps Bridge walk out x 10 reps RLE leg press 50# 3x10 Knee extension 5# 3x10; using LLE PRN but trying to use RLE as much as possible   EVAL: Nustep L5 x6 minutes BLEs only   Quad sets 1x15 5 second holds SAQs 1x10 3 second holds Single leg bridges x10 B  Walking bridges x10  LAQs yellow TB 1x5 R LE 3 second holds     PATIENT EDUCATION:  Education details: exam  findings, POC, HEP  Person educated: Patient Education method: Explanation, Demonstration, and Handouts Education comprehension: verbalized understanding and returned demonstration   HOME EXERCISE PROGRAM:  Access Code: [email or web address] URL: [email or web address] Date: 11/21/2021 Prepared by: Deniece Ree  Exercises - Supine Quad Set  - 2 x daily - 7 x weekly - 1 sets - 15-20 reps - 5 hold - Supine Short Arc Quad  - 2 x daily - 7 x weekly - 1 sets - 10-15 reps - 3 hold - Single Leg Bridge  - 2 x daily - 7 x weekly - 1 sets - 10 reps -  1 hold - Bridge Walk Out  - 2 x daily - 7 x weekly - 1 sets - 10 reps - 1 hold - Sitting Knee Extension with Resistance  - 2 x daily - 7 x weekly - 1 sets - 10 reps - 3 hold  ASSESSMENT:  CLINICAL IMPRESSION: Pt continues to have decreased quad strength at this time.  Still unable to actively obtain full extension.  Session focused on review of HEP needing min cues, but overall good understanding of exercises.  Also continued with quad strengthening.  Will continue to benefit from PT to maximize function.  OBJECTIVE IMPAIRMENTS Abnormal gait, decreased knowledge of condition, decreased mobility, difficulty walking, decreased strength, increased edema, impaired flexibility, and pain.   ACTIVITY LIMITATIONS standing, squatting, stairs, transfers, and locomotion level  PARTICIPATION LIMITATIONS: shopping, community activity, occupation, and yard work  PERSONAL FACTORS Age, Behavior pattern, Fitness, Past/current experiences, and Time since onset of injury/illness/exacerbation are also affecting patient's functional outcome.   REHAB POTENTIAL: Good  CLINICAL DECISION MAKING: Stable/uncomplicated  EVALUATION COMPLEXITY: Low   GOALS: Goals reviewed with patient? Yes  SHORT TERM GOALS: Target date: 12/19/2021  Will be compliant with appropriate progressive HEP  Baseline: Goal status: INITIAL  2.  Pain in R knee to be no more than 6/10 at  worst  Baseline: 9/10 at worst at eval  Goal status: INITIAL  3.  Will be able to perform SLR without quad lag  Baseline:  Goal status: INITIAL  4.  Will report no incidents of R knee buckling within the past 2 weeks  Baseline:  Goal status: INITIAL    LONG TERM GOALS: Target date: 01/16/2022   MMT to be at least 4+/5 in RLE globally  Baseline:  Goal status: INITIAL  2.  Will be able to perform steps and walk unlimited distances without knee buckling or pain  Baseline:  Goal status: INITIAL  3.  Pain in R knee to be no more than 2/10 at worst  Baseline:  Goal status: INITIAL  4.  Will be independent in appropriate gym based exercise program to maintain functional gains and prevent recurrence of pain/functional difficulty  Baseline:  Goal status: INITIAL    PLAN: PT FREQUENCY: 1x/week  PT DURATION: 8 weeks  PLANNED INTERVENTIONS: Therapeutic exercises, Therapeutic activity, Neuromuscular re-education, Balance training, Gait training, Patient/Family education, Self Care, Joint mobilization, Stair training, Orthotic/Fit training, Dry Needling, Electrical stimulation, Cryotherapy, Moist heat, Taping, Ultrasound, Ionotophoresis '4mg'$ /ml Dexamethasone, Manual therapy, and Re-evaluation  PLAN FOR NEXT SESSION:  R LE strengthening, focus on quad, measure quad lag   Laureen Abrahams, PT, DPT 11/28/21 9:48 AM

## 2021-11-30 NOTE — Therapy (Unsigned)
OUTPATIENT PHYSICAL THERAPY TREATMENT NOTE   Patient Name: Carla Wang MRN: [id number] DOB:August 03, 2000, 21 y.o., female Today's Date: 11/30/2021  END OF SESSION:     Past Medical History:  Diagnosis Date   Allergy    Past Surgical History:  Procedure Laterality Date   HAND SURGERY     INGUINAL HERNIA REPAIR     KNEE ARTHROSCOPY WITH MEDIAL MENISECTOMY Right 08/08/2021   Procedure: RIGHT KNEE ARTHROSCOPY WITH PARTIAL MEDIAL MENISECTOMY;  Surgeon: Leandrew Koyanagi, MD;  Location: [facility];  Service: Orthopedics;  Laterality: Right;   Patient Active Problem List   Diagnosis Date Noted   Patellofemoral dysfunction of right knee 95/62/1308   Synovial plica of right knee 65/78/4696   Acute medial meniscus tear of right knee 05/30/2021   Right shoulder strain, initial encounter 12/07/2020   ETD (Eustachian tube dysfunction), bilateral 11/29/2020   Chronic allergic rhinitis 11/29/2019   Oral contraceptive use 11/29/2019   Acne vulgaris 10/21/2017   Seborrheic dermatitis 10/21/2017   Eczema 10/21/2017   Dysmenorrhea 10/20/2015   Scoliosis 09/08/2014     THERAPY DIAG:  No diagnosis found.   PCP: Billey Chang   REFERRING PROVIDER: Aundra Dubin, PA-C   REFERRING DIAG: [phone] (ICD-10-CM) - S/P right knee arthroscopy   Rationale for Evaluation and Treatment Rehabilitation  ONSET DATE: 11/13/2021   SUBJECTIVE:   SUBJECTIVE STATEMENT: Knee is sore today; otherwise doing okay.  PERTINENT HISTORY: Plan: Patient is a pleasant 21 year old female who comes in today with right knee pain.  She is status post right knee arthroscopic debridement medial meniscus, medial plica excision and lateral release 08/08/2021.  She has been doing okay.  She has finished formal physical therapy but has recently started to wear her PSO brace again.  She notes that she is having soreness to the medial aspect of the knee but also giving way sensations with walking.  She denies  any new injury or change in activity.  Examination of her right knee reveals no effusion.  Range of motion 0 to 120 degrees.  Mild tenderness along the medial retinaculum.  No patellar apprehension.  4 out of 5 strength with resisted straight leg raise.  She is neurovascular tact distally.  At this point, I believe she needs to continue wearing her PSO brace.  I would like to also repeat start her in physical therapy to regain more quadricep strengthening.  She will follow-up with [place] as needed.  Call with concerns or questions.    PAIN:  Are you having pain? Yes: NPRS scale: 3/10 Pain location: medial R knee Pain description: constant soreness  Aggravating factors: lots of walking through the day  Relieving factors: rest and ice  PRECAUTIONS: None  WEIGHT BEARING RESTRICTIONS No  FALLS:  Has patient fallen in last 6 months? 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   PLOF: Independent, Independent with basic ADLs, Independent with gait, and Independent with transfers  PATIENT GOALS get knee stronger    OBJECTIVE:   PATIENT SURVEYS:  EVAL: FOTO 63  EDEMA:  Swelling present, has been icing   MUSCLE LENGTH: Quads mild limitation, hip flexors WNL, R HS moderate limitation/L HS mild limitation; R piriformis WNL/L piriformis mild limitation  PALPATION: TTP  medial R knee, no mm spasms or trigger points noted in quads or HS groups R LE; some edema noted around distal border of patella but this seems to be present L knee as well   LOWER EXTREMITY ROM:  Active ROM Right eval Left eval  Hip flexion    Hip extension    Hip abduction    Hip adduction    Hip internal rotation    Hip external rotation    Knee flexion WNL    Knee  extension WNL    Ankle dorsiflexion    Ankle plantarflexion    Ankle inversion    Ankle eversion     (Blank rows = not tested)    LOWER EXTREMITY MMT:  MMT Right eval Left eval  Hip flexion 5 5  Hip extension 4+ 4+  Hip abduction 4+ 4+  Hip adduction    Hip internal rotation    Hip external rotation    Knee flexion 4 4+  Knee extension 4 4+  Ankle dorsiflexion 5 5  Ankle plantarflexion    Ankle inversion    Ankle eversion     (Blank rows = not tested)  FUNCTIONAL TESTS:  SLR- 26 degree lag noted R LE   GAIT: Distance walked: in clinic distances  Assistive device utilized: None Level of assistance: Complete Independence Comments: generally WNL, does seem to favor R LE in general    TODAY'S TREATMENT: 11/28/21 TherEx: Recumbent Bike L4 x 8 min LAQs yellow TB x10 RLE 3 second holds Quad sets x 10 reps; working on heel pop -improved contraction with bil activation Rt SAQ x 10 reps; 5 sec hold RLE only bridge x 10 reps Bridge walk out x 10 reps RLE leg press 50# 3x10 Knee extension 5# 3x10; using LLE PRN but trying to use RLE as much as possible   EVAL: Nustep L5 x6 minutes BLEs only   Quad sets 1x15 5 second holds SAQs 1x10 3 second holds Single leg bridges x10 B  Walking bridges x10  LAQs yellow TB 1x5 R LE 3 second holds     PATIENT EDUCATION:  Education details: exam findings, POC, HEP  Person educated: Patient Education method: Explanation, Demonstration, and Handouts Education comprehension: verbalized understanding and returned demonstration   HOME EXERCISE PROGRAM:  Access Code: [email or web address] URL: [email or web address] Date: 11/21/2021 Prepared by: Deniece Ree  Exercises - Supine Quad Set  - 2 x daily - 7 x weekly - 1 sets - 15-20 reps - 5 hold - Supine Short Arc Quad  - 2 x daily - 7 x weekly - 1 sets - 10-15 reps - 3 hold - Single Leg Bridge  - 2 x daily - 7 x weekly - 1 sets - 10 reps - 1 hold - Bridge Walk Out  - 2 x  daily - 7 x weekly - 1 sets - 10 reps - 1 hold - Sitting Knee Extension with Resistance  - 2 x daily - 7 x weekly - 1 sets - 10 reps - 3 hold  ASSESSMENT:  CLINICAL IMPRESSION: Pt continues to have decreased quad strength at this time.  Still unable to actively obtain full extension.  Session focused on review of HEP needing min cues, but overall good understanding of exercises.  Also continued with quad strengthening.  Will continue to benefit from PT to maximize function.  OBJECTIVE IMPAIRMENTS Abnormal gait, decreased knowledge of condition, decreased mobility, difficulty walking, decreased strength, increased edema, impaired flexibility, and pain.   ACTIVITY LIMITATIONS  standing, squatting, stairs, transfers, and locomotion level  PARTICIPATION LIMITATIONS: shopping, community activity, occupation, and yard work  PERSONAL FACTORS Age, Behavior pattern, Fitness, Past/current experiences, and Time since onset of injury/illness/exacerbation are also affecting patient's functional outcome.   REHAB POTENTIAL: Good  CLINICAL DECISION MAKING: Stable/uncomplicated  EVALUATION COMPLEXITY: Low   GOALS: Goals reviewed with patient? Yes  SHORT TERM GOALS: Target date: 12/19/2021  Will be compliant with appropriate progressive HEP  Baseline: Goal status: INITIAL  2.  Pain in R knee to be no more than 6/10 at worst  Baseline: 9/10 at worst at eval  Goal status: INITIAL  3.  Will be able to perform SLR without quad lag  Baseline:  Goal status: INITIAL  4.  Will report no incidents of R knee buckling within the past 2 weeks  Baseline:  Goal status: INITIAL    LONG TERM GOALS: Target date: 01/16/2022   MMT to be at least 4+/5 in RLE globally  Baseline:  Goal status: INITIAL  2.  Will be able to perform steps and walk unlimited distances without knee buckling or pain  Baseline:  Goal status: INITIAL  3.  Pain in R knee to be no more than 2/10 at worst  Baseline:  Goal  status: INITIAL  4.  Will be independent in appropriate gym based exercise program to maintain functional gains and prevent recurrence of pain/functional difficulty  Baseline:  Goal status: INITIAL    PLAN: PT FREQUENCY: 1x/week  PT DURATION: 8 weeks  PLANNED INTERVENTIONS: Therapeutic exercises, Therapeutic activity, Neuromuscular re-education, Balance training, Gait training, Patient/Family education, Self Care, Joint mobilization, Stair training, Orthotic/Fit training, Dry Needling, Electrical stimulation, Cryotherapy, Moist heat, Taping, Ultrasound, Ionotophoresis '4mg'$ /ml Dexamethasone, Manual therapy, and Re-evaluation  PLAN FOR NEXT SESSION:  R LE strengthening, focus on quad, measure quad lag   Rudi Heap PT, DPT 11/30/21  9:32 AM

## 2021-12-03 ENCOUNTER — Encounter: Payer: PRIVATE HEALTH INSURANCE | Admitting: Family Medicine

## 2021-12-04 ENCOUNTER — Ambulatory Visit: Payer: PRIVATE HEALTH INSURANCE | Admitting: Physical Therapy

## 2021-12-04 DIAGNOSIS — R2689 Other abnormalities of gait and mobility: Secondary | ICD-10-CM | POA: Diagnosis not present

## 2021-12-04 DIAGNOSIS — M25561 Pain in right knee: Secondary | ICD-10-CM | POA: Diagnosis not present

## 2021-12-04 DIAGNOSIS — G8929 Other chronic pain: Secondary | ICD-10-CM

## 2021-12-04 DIAGNOSIS — M6281 Muscle weakness (generalized): Secondary | ICD-10-CM | POA: Diagnosis not present

## 2021-12-05 ENCOUNTER — Ambulatory Visit (INDEPENDENT_AMBULATORY_CARE_PROVIDER_SITE_OTHER): Payer: PRIVATE HEALTH INSURANCE | Admitting: Nurse Practitioner

## 2021-12-05 ENCOUNTER — Encounter: Payer: Self-pay | Admitting: Nurse Practitioner

## 2021-12-05 VITALS — BP 118/80 | HR 88 | Temp 96.9°F | Ht 64.5 in | Wt 169.0 lb

## 2021-12-05 DIAGNOSIS — K5900 Constipation, unspecified: Secondary | ICD-10-CM

## 2021-12-05 DIAGNOSIS — Z23 Encounter for immunization: Secondary | ICD-10-CM

## 2021-12-05 DIAGNOSIS — J309 Allergic rhinitis, unspecified: Secondary | ICD-10-CM

## 2021-12-05 DIAGNOSIS — J209 Acute bronchitis, unspecified: Secondary | ICD-10-CM | POA: Diagnosis not present

## 2021-12-05 MED ORDER — LORATADINE 10 MG PO TABS: 10.0000 mg | ORAL_TABLET | Freq: Every day | ORAL | Status: DC

## 2021-12-05 MED ORDER — MONTELUKAST SODIUM 10 MG PO TABS: 10.0000 mg | ORAL_TABLET | Freq: Every day | ORAL | 1 refills | Status: DC

## 2021-12-05 MED ORDER — DOCUSATE SODIUM 100 MG PO CAPS: 100.0000 mg | ORAL_CAPSULE | Freq: Two times a day (BID) | ORAL | 0 refills | Status: DC

## 2021-12-05 MED ORDER — METHYLPREDNISOLONE 4 MG PO TBPK: ORAL_TABLET | ORAL | 0 refills | Status: DC

## 2021-12-05 MED ORDER — FLUTICASONE PROPIONATE 50 MCG/ACT NA SUSP: 2.0000 | Freq: Every day | NASAL | 3 refills | Status: DC

## 2021-12-05 NOTE — Progress Notes (Signed)
Established Patient Visit  Patient: Carla Wang   DOB: 10/26/2000   21 y.o. Female  MRN: [id number] Visit Date: 12/05/2021  Subjective:    Chief Complaint  Patient presents with   Establish Care    TOC- Est Care Allergies Flu vaccine   HPI Chronic allergic rhinitis Chronic nasal and sinus congestion, worse in fall season. No previous eval by allergist. No fever, no purulent drainage. Has sinus pressure, postnasal drainage and cough. No CP or SOB or night sweats. No Hx of asthma. Chronic second hand tobacco smoke exposure. Current use of claritin OTC with no relief.  Sent oral medrol, flonase and monelukast. Use saline nasal spray as needed Use cool mist humidifier during winter period. Call office if no improvement in 2weeks.  Constipation Associated with ABD bloating and nausea. BM:1-2x/week. pellets No GERD, no bleching. Skips lunch, drinks 2bottles of H2O ETOH use: every other weekend, 4-5shots  Start colace '100mg'$  twice a day Maintain high fiber diet and increase water intake Avoid skipping meals.  Reviewed medical, surgical, and social history today  Medications: Outpatient Medications Prior to Visit  Medication Sig   diclofenac (VOLTAREN) 75 MG EC tablet Take 1 tablet (75 mg total) by mouth 2 (two) times daily as needed.   ibuprofen (ADVIL) 600 MG tablet Take 1 tablet (600 mg total) by mouth every 8 (eight) hours as needed (take after eating).   norethindrone-ethinyl estradiol-iron (ESTROSTEP FE,TILIA FE,TRI-LEGEST FE) 1-20/1-30/1-35 MG-MCG tablet Take 1 tablet by mouth daily.   [DISCONTINUED] fluticasone (FLONASE) 50 MCG/ACT nasal spray Place into both nostrils daily.   [DISCONTINUED] montelukast (SINGULAIR) 10 MG tablet Take 1 tablet (10 mg total) by mouth at bedtime.   [DISCONTINUED] cephALEXin (KEFLEX) 500 MG capsule Take 1 capsule (500 mg total) by mouth 2 (two) times daily. (Patient not taking: Reported on 12/05/2021)   [DISCONTINUED]  fluticasone (FLONASE) 50 MCG/ACT nasal spray INSTILL 2 SPRAYS IN EACH NOSTRIL ONCE DAILY (Patient not taking: Reported on 12/05/2021)   No facility-administered medications prior to visit.   Reviewed past medical and social history.   ROS per HPI above      Objective:  BP 118/80 (BP Location: Right Arm, Patient Position: Sitting, Cuff Size: Normal)   Pulse 88   Temp (!) 96.9 F (36.1 C) (Temporal)   Ht 5' 4.5" (1.638 m)   Wt 169 lb (76.7 kg)   SpO2 99%   BMI 28.56 kg/m      Physical Exam HENT:     Head: Normocephalic.     Jaw: There is normal jaw occlusion.     Salivary Glands: Right salivary gland is not diffusely enlarged or tender. Left salivary gland is not diffusely enlarged or tender.     Right Ear: Hearing, ear canal and external ear normal. No drainage, swelling or tenderness. A middle ear effusion is present. There is no impacted cerumen. No mastoid tenderness. Tympanic membrane is not injected, scarred, perforated or erythematous.     Left Ear: Ear canal and external ear normal. No drainage, swelling or tenderness. A middle ear effusion is present. There is no impacted cerumen. No mastoid tenderness. Tympanic membrane is not injected, scarred, perforated or erythematous.     Nose: Mucosal edema, congestion and rhinorrhea present.     Right Nostril: No occlusion.     Left Nostril: No occlusion.     Right Turbinates: Swollen.     Left Turbinates: Swollen.  Right Sinus: Maxillary sinus tenderness and frontal sinus tenderness present.     Left Sinus: Maxillary sinus tenderness and frontal sinus tenderness present.     Mouth/Throat:     Pharynx: Uvula midline. No oropharyngeal exudate, posterior oropharyngeal erythema or uvula swelling.     Tonsils: No tonsillar exudate.  Cardiovascular:     Rate and Rhythm: Normal rate and regular rhythm.     Pulses: Normal pulses.     Heart sounds: Normal heart sounds.  Pulmonary:     Effort: Pulmonary effort is normal.      Breath sounds: Normal breath sounds.  Abdominal:     General: Bowel sounds are normal.     Palpations: Abdomen is soft.  Neurological:     Mental Status: She is oriented to person, place, and time.     No results found for any visits on 12/05/21.    Assessment & Plan:    Problem List Items Addressed This Visit       Respiratory   Chronic allergic rhinitis    Chronic nasal and sinus congestion, worse in fall season. No previous eval by allergist. No fever, no purulent drainage. Has sinus pressure, postnasal drainage and cough. No CP or SOB or night sweats. No Hx of asthma. Chronic second hand tobacco smoke exposure. Current use of claritin OTC with no relief.  Sent oral medrol, flonase and monelukast. Use saline nasal spray as needed Use cool mist humidifier during winter period. Call office if no improvement in 2weeks.      Relevant Medications   loratadine (CLARITIN) 10 MG tablet   fluticasone (FLONASE) 50 MCG/ACT nasal spray   montelukast (SINGULAIR) 10 MG tablet     Other   Constipation    Associated with ABD bloating and nausea. BM:1-2x/week. pellets No GERD, no bleching. Skips lunch, drinks 2bottles of H2O ETOH use: every other weekend, 4-5shots  Start colace '100mg'$  twice a day Maintain high fiber diet and increase water intake Avoid skipping meals.      Relevant Medications   docusate sodium (COLACE) 100 MG capsule   Other Visit Diagnoses     Acute bronchitis, unspecified organism    -  Primary   Relevant Medications   methylPREDNISolone (MEDROL DOSEPAK) 4 MG TBPK tablet   Need for immunization against influenza       Relevant Orders   Flu Vaccine QUAD 6+ mos PF IM (Fluarix Quad PF) (Completed)      Return in about 3 months (around 03/07/2022) for CPE (fasting).     Wilfred Lacy, NP

## 2021-12-05 NOTE — Assessment & Plan Note (Addendum)
Chronic nasal and sinus congestion, worse in fall season. No previous eval by allergist. No fever, no purulent drainage. Has sinus pressure, postnasal drainage and cough. No CP or SOB or night sweats. No Hx of asthma. Chronic second hand tobacco smoke exposure. Current use of claritin OTC with no relief.  Sent oral medrol, flonase and monelukast. Use saline nasal spray as needed Use cool mist humidifier during winter period. Call office if no improvement in 2weeks.

## 2021-12-05 NOTE — Patient Instructions (Signed)
Constipation and abdominal bloating: Start colace '100mg'$  twice a day Maintain high fiber diet and increase water intake Avoid skipping meals.  Allergic rhinitis: Start oral prednisone, flonase and montelukast Continue claritin. Use saline nasal spray as needed Use cool mist humidifier during winter period. Call office if no improvement in 2weeks.

## 2021-12-05 NOTE — Assessment & Plan Note (Signed)
Associated with ABD bloating and nausea. BM:1-2x/week. pellets No GERD, no bleching. Skips lunch, drinks 2bottles of H2O ETOH use: every other weekend, 4-5shots  Start colace '100mg'$  twice a day Maintain high fiber diet and increase water intake Avoid skipping meals.

## 2021-12-12 ENCOUNTER — Encounter: Payer: Self-pay | Admitting: Nurse Practitioner

## 2021-12-12 ENCOUNTER — Encounter: Payer: Self-pay | Admitting: Physical Therapy

## 2021-12-12 ENCOUNTER — Ambulatory Visit: Payer: PRIVATE HEALTH INSURANCE | Admitting: Physical Therapy

## 2021-12-12 DIAGNOSIS — M6281 Muscle weakness (generalized): Secondary | ICD-10-CM

## 2021-12-12 DIAGNOSIS — R2689 Other abnormalities of gait and mobility: Secondary | ICD-10-CM | POA: Diagnosis not present

## 2021-12-12 DIAGNOSIS — G8929 Other chronic pain: Secondary | ICD-10-CM

## 2021-12-12 DIAGNOSIS — M25561 Pain in right knee: Secondary | ICD-10-CM | POA: Diagnosis not present

## 2021-12-12 NOTE — Therapy (Signed)
OUTPATIENT PHYSICAL THERAPY TREATMENT NOTE   Patient Name: Carla Wang MRN: [id number] DOB:[birth year], 21 y.o., female Today's Date: 12/12/2021  END OF SESSION:   PT End of Session - 12/12/21 0937     Visit Number 4    Number of Visits 9    Date for PT Re-Evaluation 01/16/22    Authorization Type [employer]    Authorization Time Period 11/21/21 to 01/16/22    Authorization - Number of Visits 2    PT Start Time 0935    PT Stop Time 1006    PT Time Calculation (min) 31 min    Activity Tolerance Patient tolerated treatment well    Behavior During Therapy WFL for tasks assessed/performed               Past Medical History:  Diagnosis Date   Allergy    Past Surgical History:  Procedure Laterality Date   HAND SURGERY     INGUINAL HERNIA REPAIR     KNEE ARTHROSCOPY WITH MEDIAL MENISECTOMY Right 08/08/2021   Procedure: RIGHT KNEE ARTHROSCOPY WITH PARTIAL MEDIAL MENISECTOMY;  Surgeon: Leandrew Koyanagi, MD;  Location: [facility];  Service: Orthopedics;  Laterality: Right;   Patient Active Problem List   Diagnosis Date Noted   Constipation 12/05/2021   Patellofemoral dysfunction of right knee 56/38/7564   Synovial plica of right knee 33/29/5188   Acute medial meniscus tear of right knee 05/30/2021   ETD (Eustachian tube dysfunction), bilateral 11/29/2020   Chronic allergic rhinitis 11/29/2019   Oral contraceptive use 11/29/2019   Acne vulgaris 10/21/2017   Seborrheic dermatitis 10/21/2017   Eczema 10/21/2017   Dysmenorrhea 10/20/2015   Scoliosis 09/08/2014     THERAPY DIAG:  Chronic pain of right knee  Muscle weakness (generalized)  Acute pain of right knee  Other abnormalities of gait and mobility   PCP: Billey Chang   REFERRING PROVIDER: Aundra Dubin, PA-C   REFERRING DIAG: [phone] (ICD-10-CM) - S/P right knee arthroscopy   Rationale for Evaluation and Treatment Rehabilitation  ONSET DATE: 11/13/2021   SUBJECTIVE:   SUBJECTIVE  STATEMENT: No pain for about 3 days; is icing her knee a little more  PERTINENT HISTORY: Plan: Patient is a pleasant 21 year old female who comes in today with right knee pain.  She is status post right knee arthroscopic debridement medial meniscus, medial plica excision and lateral release 08/08/2021.  She has been doing okay.  She has finished formal physical therapy but has recently started to wear her PSO brace again.  She notes that she is having soreness to the medial aspect of the knee but also giving way sensations with walking.  She denies any new injury or change in activity.  Examination of her right knee reveals no effusion.  Range of motion 0 to 120 degrees.  Mild tenderness along the medial retinaculum.  No patellar apprehension.  4 out of 5 strength with resisted straight leg raise.  She is neurovascular tact distally.  At this point, I believe she needs to continue wearing her PSO brace.  I would like to also repeat start her in physical therapy to regain more quadricep strengthening.  She will follow-up with [place] as needed.  Call with concerns or questions.    PAIN:  Are you having pain? Yes: NPRS scale: 0/10 Pain location: medial R knee Pain description: constant soreness  Aggravating factors: lots of walking through the day  Relieving factors: rest and ice  PRECAUTIONS: None  WEIGHT BEARING RESTRICTIONS No  FALLS:  Has patient fallen in last 6 months? 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   PLOF: Independent, Independent with basic ADLs, Independent with gait, and Independent with transfers  PATIENT GOALS get knee stronger    OBJECTIVE:   PATIENT SURVEYS:  EVAL: FOTO 63 12/12/21: FOTO 72  EDEMA:  Swelling present,  has been icing   MUSCLE LENGTH: Quads mild limitation, hip flexors WNL, R HS moderate limitation/L HS mild limitation; R piriformis WNL/L piriformis mild limitation  PALPATION: TTP medial R knee, no mm spasms or trigger points noted in quads or HS groups R LE; some edema noted around distal border of patella but this seems to be present L knee as well   LOWER EXTREMITY ROM:  Active ROM Right eval Left eval  Hip flexion    Hip extension    Hip abduction    Hip adduction    Hip internal rotation    Hip external rotation    Knee flexion WNL    Knee extension WNL    Ankle dorsiflexion    Ankle plantarflexion    Ankle inversion    Ankle eversion     (Blank rows = not tested)    LOWER EXTREMITY MMT:  MMT Right eval Left eval  Hip flexion 5 5  Hip extension 4+ 4+  Hip abduction 4+ 4+  Hip adduction    Hip internal rotation    Hip external rotation    Knee flexion 4 4+  Knee extension 4 4+  Ankle dorsiflexion 5 5  Ankle plantarflexion    Ankle inversion    Ankle eversion     (Blank rows = not tested)  FUNCTIONAL TESTS:  SLR- 26 degree lag noted R LE   GAIT: Distance walked: in clinic distances  Assistive device utilized: None Level of assistance: Complete Independence Comments: generally WNL, does seem to favor R LE in general    TODAY'S TREATMENT: 12/12/21 TherEx: Leg Press RLE only 50# 3x10 Knee extension #10 2x10 with 2 up/1 down, with focus on eccentric control Knee flexion 20# 2x10; focus on RLE only with A from LLE PRN Squats on ramp with 5# weight 2x10; cues to increase RLE weight bearing TRX RLE squats 2x10; Lt heel tap for balance TRX RLE deadlift 2x10 Walking lunges 25' x 2 Standing Rt quad stretch 2x15 sec hold  12/04/21 TherEx: Recumbent Bike L4 x 8 min Knee extension #5 2x10  fusion with 2 up/1 down, with focus on eccentric control Quad sets x 10 reps; working on heel pop -improved contraction with bil activation RLE only bridge x 10  reps Bridge walk out x 10 reps RLE leg press 50# 3x10  STM to medial HS due to reported pain. Pt reports improvements upon completion.   11/28/21 TherEx: Recumbent Bike L4 x 8 min LAQs yellow TB x10 RLE 3 second holds Quad sets x 10 reps; working on heel pop -improved contraction with bil activation Rt SAQ x 10 reps; 5 sec hold RLE only bridge x 10 reps Bridge walk out x 10 reps RLE leg press 50# 3x10 Knee extension 5# 3x10; using LLE PRN but trying to use RLE as much as possible    PATIENT EDUCATION:  Education details: exam findings,  POC, HEP  Person educated: Patient Education method: Explanation, Demonstration, and Handouts Education comprehension: verbalized understanding and returned demonstration   HOME EXERCISE PROGRAM:  Access Code: [email or web address] URL: [email or web address] Date: 11/21/2021 Prepared by: Deniece Ree  Exercises - Supine Quad Set  - 2 x daily - 7 x weekly - 1 sets - 15-20 reps - 5 hold - Supine Short Arc Quad  - 2 x daily - 7 x weekly - 1 sets - 10-15 reps - 3 hold - Single Leg Bridge  - 2 x daily - 7 x weekly - 1 sets - 10 reps - 1 hold - Bridge Walk Out  - 2 x daily - 7 x weekly - 1 sets - 10 reps - 1 hold - Sitting Knee Extension with Resistance  - 2 x daily - 7 x weekly - 1 sets - 10 reps - 3 hold  ASSESSMENT:  CLINICAL IMPRESSION: Able to progress strengthening activities today with good tolerance, except pt reporting increased fatigue at end of session.  Min cues needed throughout exercises for technique.  Will continue to benefit from PT to maximize function.  FOTO score improved 9% today.  OBJECTIVE IMPAIRMENTS Abnormal gait, decreased knowledge of condition, decreased mobility, difficulty walking, decreased strength, increased edema, impaired flexibility, and pain.   ACTIVITY LIMITATIONS standing, squatting, stairs, transfers, and locomotion level  PARTICIPATION LIMITATIONS: shopping, community activity, occupation, and yard  work  PERSONAL FACTORS Age, Behavior pattern, Fitness, Past/current experiences, and Time since onset of injury/illness/exacerbation are also affecting patient's functional outcome.   REHAB POTENTIAL: Good  CLINICAL DECISION MAKING: Stable/uncomplicated  EVALUATION COMPLEXITY: Low   GOALS: Goals reviewed with patient? Yes  SHORT TERM GOALS: Target date: 12/19/2021  Will be compliant with appropriate progressive HEP  Baseline: Goal status: INITIAL  2.  Pain in R knee to be no more than 6/10 at worst  Baseline: 9/10 at worst at eval  Goal status: INITIAL  3.  Will be able to perform SLR without quad lag  Baseline:  Goal status: INITIAL  4.  Will report no incidents of R knee buckling within the past 2 weeks  Baseline:  Goal status: INITIAL    LONG TERM GOALS: Target date: 01/16/2022   MMT to be at least 4+/5 in RLE globally  Baseline:  Goal status: INITIAL  2.  Will be able to perform steps and walk unlimited distances without knee buckling or pain  Baseline:  Goal status: INITIAL  3.  Pain in R knee to be no more than 2/10 at worst  Baseline:  Goal status: INITIAL  4.  Will be independent in appropriate gym based exercise program to maintain functional gains and prevent recurrence of pain/functional difficulty  Baseline:  Goal status: INITIAL    PLAN: PT FREQUENCY: 1x/week  PT DURATION: 8 weeks  PLANNED INTERVENTIONS: Therapeutic exercises, Therapeutic activity, Neuromuscular re-education, Balance training, Gait training, Patient/Family education, Self Care, Joint mobilization, Stair training, Orthotic/Fit training, Dry Needling, Electrical stimulation, Cryotherapy, Moist heat, Taping, Ultrasound, Ionotophoresis '4mg'$ /ml Dexamethasone, Manual therapy, and Re-evaluation  PLAN FOR NEXT SESSION:  check STGs, R LE strengthening, focus on quad, measure quad lag   Laureen Abrahams, PT, DPT 12/12/21 10:08 AM

## 2021-12-14 ENCOUNTER — Ambulatory Visit (INDEPENDENT_AMBULATORY_CARE_PROVIDER_SITE_OTHER): Payer: PRIVATE HEALTH INSURANCE | Admitting: Nurse Practitioner

## 2021-12-14 ENCOUNTER — Encounter: Payer: Self-pay | Admitting: Nurse Practitioner

## 2021-12-14 VITALS — BP 110/70 | HR 101 | Temp 98.2°F | Ht 64.5 in | Wt 169.8 lb

## 2021-12-14 DIAGNOSIS — H6993 Unspecified Eustachian tube disorder, bilateral: Secondary | ICD-10-CM | POA: Diagnosis not present

## 2021-12-14 DIAGNOSIS — J0140 Acute pansinusitis, unspecified: Secondary | ICD-10-CM

## 2021-12-14 DIAGNOSIS — J029 Acute pharyngitis, unspecified: Secondary | ICD-10-CM

## 2021-12-14 LAB — POC COVID19 BINAXNOW

## 2021-12-14 LAB — POCT INFLUENZA A/B: Influenza B, POC: NEGATIVE

## 2021-12-14 MED ORDER — DOXYCYCLINE HYCLATE 100 MG PO TABS: 100.0000 mg | ORAL_TABLET | Freq: Two times a day (BID) | ORAL | 0 refills | Status: AC

## 2021-12-14 MED ORDER — PREDNISONE 20 MG PO TABS: ORAL_TABLET | ORAL | 0 refills | Status: AC

## 2021-12-14 NOTE — Patient Instructions (Signed)
Continue flonase and claritin Start prednisone and doxycycline Call office if no improvement in 1week  Sinus Infection, Adult A sinus infection is soreness and swelling (inflammation) of your sinuses. Sinuses are hollow spaces in the bones around your face. They are located: Around your eyes. In the middle of your forehead. Behind your nose. In your cheekbones. Your sinuses and nasal passages are lined with a fluid called mucus. Mucus drains out of your sinuses. Swelling can trap mucus in your sinuses. This lets germs (bacteria, virus, or fungus) grow, which leads to infection. Most of the time, this condition is caused by a virus. What are the causes? Allergies. Asthma. Germs. Things that block your nose or sinuses. Growths in the nose (nasal polyps). Chemicals or irritants in the air. A fungus. This is rare. What increases the risk? Having a weak body defense system (immune system). Doing a lot of swimming or diving. Using nasal sprays too much. Smoking. What are the signs or symptoms? The main symptoms of this condition are pain and a feeling of pressure around the sinuses. Other symptoms include: Stuffy nose (congestion). This may make it hard to breathe through your nose. Runny nose (drainage). Soreness, swelling, and warmth in the sinuses. A cough that may get worse at night. Being unable to smell and taste. Mucus that collects in the throat or the back of the nose (postnasal drip). This may cause a sore throat or bad breath. Being very tired (fatigued). A fever. How is this diagnosed? Your symptoms. Your medical history. A physical exam. Tests to find out if your condition is short-term (acute) or long-term (chronic). Your doctor may: Check your nose for growths (polyps). Check your sinuses using a tool that has a light on one end (endoscope). Check for allergies or germs. Do imaging tests, such as an MRI or CT scan. How is this treated? Treatment for this condition  depends on the cause and whether it is short-term or long-term. If caused by a virus, your symptoms should go away on their own within 10 days. You may be given medicines to relieve symptoms. They include: Medicines that shrink swollen tissue in the nose. A spray that treats swelling of the nostrils. Rinses that help get rid of thick mucus in your nose (nasal saline washes). Medicines that treat allergies (antihistamines). Over-the-counter pain relievers. If caused by bacteria, your doctor may wait to see if you will get better without treatment. You may be given antibiotic medicine if you have: A very bad infection. A weak body defense system. If caused by growths in the nose, surgery may be needed. Follow these instructions at home: Medicines Take, use, or apply over-the-counter and prescription medicines only as told by your doctor. These may include nasal sprays. If you were prescribed an antibiotic medicine, take it as told by your doctor. Do not stop taking it even if you start to feel better. Hydrate and humidify  Drink enough water to keep your pee (urine) pale yellow. Use a cool mist humidifier to keep the humidity level in your home above 50%. Breathe in steam for 10-15 minutes, 3-4 times a day, or as told by your doctor. You can do this in the bathroom while a hot shower is running. Try not to spend time in cool or dry air. Rest Rest as much as you can. Sleep with your head raised (elevated). Make sure you get enough sleep each night. General instructions  Put a warm, moist washcloth on your face 3-4 times a  day, or as often as told by your doctor. Use nasal saline washes as often as told by your doctor. Wash your hands often with soap and water. If you cannot use soap and water, use hand sanitizer. Do not smoke. Avoid being around people who are smoking (secondhand smoke). Keep all follow-up visits. Contact a doctor if: You have a fever. Your symptoms get worse. Your  symptoms do not get better within 10 days. Get help right away if: You have a very bad headache. You cannot stop vomiting. You have very bad pain or swelling around your face or eyes. You have trouble seeing. You feel confused. Your neck is stiff. You have trouble breathing. These symptoms may be an emergency. Get help right away. Call 911. Do not wait to see if the symptoms will go away. Do not drive yourself to the hospital. Summary A sinus infection is swelling of your sinuses. Sinuses are hollow spaces in the bones around your face. This condition is caused by tissues in your nose that become inflamed or swollen. This traps germs. These can lead to infection. If you were prescribed an antibiotic medicine, take it as told by your doctor. Do not stop taking it even if you start to feel better. Keep all follow-up visits. This information is not intended to replace advice given to you by your health care provider. Make sure you discuss any questions you have with your health care provider. Document Revised: 01/23/2021 Document Reviewed: 01/23/2021 Elsevier Patient Education  [place].

## 2021-12-14 NOTE — Assessment & Plan Note (Signed)
Persistent sinus congestion x 3weeks, no improvement with oral prednisone, flonase and claritin.  Start doxycycline and another 5day course of prednisone Get x-ray sinus and/or ref to ENT if no improvement in 1week

## 2021-12-14 NOTE — Progress Notes (Unsigned)
Established Patient Visit  Patient: Carla Wang   DOB: Nov 23, 2000   21 y.o. Female  MRN: [id number] Visit Date: 12/14/2021  Subjective:    Chief Complaint  Patient presents with   Acute Visit    Pt c/o soar throat right ear pain sinus presser x3 weeks   HPI Acute non-recurrent pansinusitis Persistent sinus congestion x 3weeks, no improvement with oral prednisone, flonase and claritin.  Start doxycycline and another 5day course of prednisone Get x-ray sinus and/or ref to ENT if no improvement in 1week  Reviewed medical, surgical, and social history today  Medications: Outpatient Medications Prior to Visit  Medication Sig   diclofenac (VOLTAREN) 75 MG EC tablet Take 1 tablet (75 mg total) by mouth 2 (two) times daily as needed.   docusate sodium (COLACE) 100 MG capsule Take 1 capsule (100 mg total) by mouth 2 (two) times daily.   fluticasone (FLONASE) 50 MCG/ACT nasal spray Place 2 sprays into both nostrils daily.   ibuprofen (ADVIL) 600 MG tablet Take 1 tablet (600 mg total) by mouth every 8 (eight) hours as needed (take after eating).   LO LOESTRIN FE 1 MG-10 MCG / 10 MCG tablet Take 1 tablet by mouth daily.   loratadine (CLARITIN) 10 MG tablet Take 1 tablet (10 mg total) by mouth daily.   montelukast (SINGULAIR) 10 MG tablet Take 1 tablet (10 mg total) by mouth at bedtime.   norethindrone-ethinyl estradiol-iron (ESTROSTEP FE,TILIA FE,TRI-LEGEST FE) 1-20/1-30/1-35 MG-MCG tablet Take 1 tablet by mouth daily.   [DISCONTINUED] methylPREDNISolone (MEDROL DOSEPAK) 4 MG TBPK tablet Take as directed on package   No facility-administered medications prior to visit.   Reviewed past medical and social history.   ROS per HPI above  {Show previous labs (optional):23779}    Objective:  BP 110/70 (BP Location: Right Arm, Patient Position: Sitting, Cuff Size: Normal)   Pulse (!) 101   Temp 98.2 F (36.8 C) (Temporal)   Ht 5' 4.5" (1.638 m)   Wt 169 lb 12.8 oz (77  kg)   SpO2 97%   BMI 28.70 kg/m      Physical Exam Vitals reviewed.  HENT:     Right Ear: Ear canal and external ear normal. A middle ear effusion is present. There is no impacted cerumen. No mastoid tenderness. Tympanic membrane is not injected, scarred, perforated, erythematous, retracted or bulging.     Left Ear: Ear canal and external ear normal. A middle ear effusion is present. There is no impacted cerumen. No mastoid tenderness. Tympanic membrane is not injected, scarred, perforated, erythematous, retracted or bulging.     Nose: Mucosal edema, congestion and rhinorrhea present.     Right Turbinates: Enlarged and swollen.     Left Turbinates: Enlarged and swollen.     Right Sinus: Maxillary sinus tenderness and frontal sinus tenderness present.     Left Sinus: Maxillary sinus tenderness and frontal sinus tenderness present.  Cardiovascular:     Rate and Rhythm: Normal rate.     Pulses: Normal pulses.  Pulmonary:     Effort: Pulmonary effort is normal.  Neurological:     Mental Status: She is alert.     Results for orders placed or performed in visit on 12/14/21  POC COVID-19 BinaxNow  Result Value Ref Range   SARS Coronavirus 2 Ag    POCT Influenza A/B  Result Value Ref Range   Influenza A, POC  Influenza B, POC Negative Negative      Assessment & Plan:    Problem List Items Addressed This Visit       Respiratory   Acute non-recurrent pansinusitis - Primary    Persistent sinus congestion x 3weeks, no improvement with oral prednisone, flonase and claritin.  Start doxycycline and another 5day course of prednisone Get x-ray sinus and/or ref to ENT if no improvement in 1week      Relevant Medications   doxycycline (VIBRA-TABS) 100 MG tablet   predniSONE (DELTASONE) 20 MG tablet   Other Visit Diagnoses     Sore throat       Relevant Orders   POC COVID-19 BinaxNow (Completed)   POCT Influenza A/B (Completed)   Eustachian tube dysfunction, bilateral        Relevant Medications   doxycycline (VIBRA-TABS) 100 MG tablet   predniSONE (DELTASONE) 20 MG tablet      No follow-ups on file.     Wilfred Lacy, NP

## 2021-12-18 NOTE — Therapy (Signed)
OUTPATIENT PHYSICAL THERAPY TREATMENT NOTE DISCHARGE SUMMARY   Patient Name: Carla Wang MRN: [id number] DOB:26-Jan-2001, 21 y.o., female Today's Date: 12/19/2021  END OF SESSION:   PT End of Session - 12/19/21 0843     Visit Number 5    Number of Visits 9    Date for PT Re-Evaluation 01/16/22    Authorization Type [employer]    Authorization Time Period 11/21/21 to 01/16/22    Authorization - Number of Visits 42    PT Start Time 0841    PT Stop Time 0901    PT Time Calculation (min) 20 min    Activity Tolerance Patient tolerated treatment well    Behavior During Therapy [facility] for tasks assessed/performed                Past Medical History:  Diagnosis Date   Allergy    Past Surgical History:  Procedure Laterality Date   HAND SURGERY     INGUINAL HERNIA REPAIR     KNEE ARTHROSCOPY WITH MEDIAL MENISECTOMY Right 08/08/2021   Procedure: RIGHT KNEE ARTHROSCOPY WITH PARTIAL MEDIAL MENISECTOMY;  Surgeon: Leandrew Koyanagi, MD;  Location: [place];  Service: Orthopedics;  Laterality: Right;   Patient Active Problem List   Diagnosis Date Noted   Acute non-recurrent pansinusitis 12/14/2021   Constipation 12/05/2021   Patellofemoral dysfunction of right knee 28/36/6294   Synovial plica of right knee 76/54/6503   Acute medial meniscus tear of right knee 05/30/2021   ETD (Eustachian tube dysfunction), bilateral 11/29/2020   Chronic allergic rhinitis 11/29/2019   Acne vulgaris 10/21/2017   Seborrheic dermatitis 10/21/2017   Eczema 10/21/2017   Dysmenorrhea 10/20/2015   Scoliosis 09/08/2014     THERAPY DIAG:  Chronic pain of right knee  Muscle weakness (generalized)  Acute pain of right knee  Other abnormalities of gait and mobility   PCP: Billey Chang   REFERRING PROVIDER: Aundra Dubin, PA-C   REFERRING DIAG: [id number] (ICD-10-CM) - S/P right knee arthroscopy   Rationale for Evaluation and Treatment Rehabilitation  ONSET DATE: 11/13/2021    SUBJECTIVE:   SUBJECTIVE STATEMENT: Had a couple episodes of buckling, but able to catch herself without falling.  No pain recently  PERTINENT HISTORY: Plan: Patient is a pleasant 21 year old female who comes in today with right knee pain.  She is status post right knee arthroscopic debridement medial meniscus, medial plica excision and lateral release 08/08/2021.  She has been doing okay.  She has finished formal physical therapy but has recently started to wear her PSO brace again.  She notes that she is having soreness to the medial aspect of the knee but also giving way sensations with walking.  She denies any new injury or change in activity.  Examination of her right knee reveals no effusion.  Range of motion 0 to 120 degrees.  Mild tenderness along the medial retinaculum.  No patellar apprehension.  4 out of 5 strength with resisted straight leg raise.  She is neurovascular tact distally.  At this point, I believe she needs to continue wearing her PSO brace.  I would like to also repeat start her in physical therapy to regain more quadricep strengthening.  She will follow-up with [place] as needed.  Call with concerns or questions.    PAIN:  Are you having pain? Yes: NPRS scale: 0/10 Pain location: medial R knee Pain description: constant soreness  Aggravating factors: lots of walking through the day  Relieving factors: rest and ice  PRECAUTIONS: None  WEIGHT BEARING RESTRICTIONS No  FALLS:  Has patient fallen in last 6 months? 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   PLOF: Independent, Independent with basic ADLs, Independent with gait, and Independent with transfers  PATIENT GOALS get knee stronger    OBJECTIVE:   PATIENT  SURVEYS:  EVAL: FOTO 63 12/12/21: FOTO 72  EDEMA:  Swelling present, has been icing   MUSCLE LENGTH: Quads mild limitation, hip flexors WNL, R HS moderate limitation/L HS mild limitation; R piriformis WNL/L piriformis mild limitation  PALPATION: TTP medial R knee, no mm spasms or trigger points noted in quads or HS groups R LE; some edema noted around distal border of patella but this seems to be present L knee as well   LOWER EXTREMITY ROM:  Active ROM Right eval Left eval  Hip flexion    Hip extension    Hip abduction    Hip adduction    Hip internal rotation    Hip external rotation    Knee flexion WNL    Knee extension WNL    Ankle dorsiflexion    Ankle plantarflexion    Ankle inversion    Ankle eversion     (Blank rows = not tested)    LOWER EXTREMITY MMT:  MMT Right eval Left eval Right 12/19/21  Hip flexion 5 5   Hip extension 4+ 4+   Hip abduction 4+ 4+   Hip adduction     Hip internal rotation     Hip external rotation     Knee flexion 4 4+ 4+/5  Knee extension 4 4+ 5/5  Ankle dorsiflexion 5 5   Ankle plantarflexion     Ankle inversion     Ankle eversion      (Blank rows = not tested)  FUNCTIONAL TESTS:  SLR- 26 degree lag noted R LE   GAIT: Distance walked: in clinic distances  Assistive device utilized: None Level of assistance: Complete Independence Comments: generally WNL, does seem to favor R LE in general    TODAY'S TREATMENT: 12/19/21 TherEx: Recumbent bike L5 x 8 min Leg Press RLE only 50# 3x10 No quad lag with SLR (no pain) Discussed gym program and updated HEP for pt; also discussed app based exercise programs for home if needed Negotiated stairs without railing reciprocally without evidence of instability  12/12/21 TherEx: Leg Press RLE only 50# 3x10 Knee extension #10 2x10 with 2 up/1 down, with focus on eccentric control Knee flexion 20# 2x10; focus on RLE only with A from LLE PRN Squats on ramp with 5# weight 2x10;  cues to increase RLE weight bearing TRX RLE squats 2x10; Lt heel tap for balance TRX RLE deadlift 2x10 Walking lunges 25' x 2 Standing Rt quad stretch 2x15 sec hold  12/04/21 TherEx: Recumbent Bike L4 x 8 min Knee extension #5 2x10  fusion with 2 up/1 down, with focus on eccentric control Quad sets x 10 reps; working on heel pop -improved contraction with bil activation RLE only bridge x 10 reps Bridge walk out x 10 reps RLE leg press 50# 3x10  STM to medial HS due to reported pain. Pt reports improvements upon completion.   PATIENT EDUCATION:  Education details: exam findings, POC, HEP  Person educated: Patient Education method: Explanation, Demonstration, and Handouts Education comprehension: verbalized understanding and returned demonstration   HOME EXERCISE PROGRAM: Access Code: [email or web address] URL: [email or web address] Date: 12/19/2021 Prepared by: Faustino Congress  Exercises - Single Leg Bridge  - 2 x daily - 7 x weekly - 1 sets - 10 reps - 1 hold - Bridge Walk Out  - 2 x daily - 7 x weekly - 1 sets - 10 reps - 1 hold - Full Leg Press  - 1 x daily - 7 x weekly - 3 sets - 10 reps - Single Leg Press  - 1 x daily - 7 x weekly - 3 sets - 10 reps - Single Leg Knee Extension with Weight Machine  - 1 x daily - 7 x weekly - 3 sets - 10 reps - Single Leg Hamstring Curl with Weight Machine  - 1 x daily - 7 x weekly - 3 sets - 10 reps - Hip Adduction Machine  - 1 x daily - 7 x weekly - 3 sets - 10 reps - Hip Abduction Machine  - 1 x daily - 7 x weekly - 3 sets - 10 reps - Goblet Squat with Kettlebell  - 1 x daily - 7 x weekly - 3 sets - 10 reps - Kettlebell Deadlift  - 1 x daily - 7 x weekly - 3 sets - 10 reps - Single Leg Deadlift with Kettlebell  - 1 x daily - 7 x weekly - 3 sets - 10 reps - Lateral Lunge  - 1 x daily - 7 x weekly - 3 sets - 10 reps  ASSESSMENT:  CLINICAL IMPRESSION: Pt has met/partially met all goals at this time and feels ready for d/c.  Will  d/c PT today, and encouraged continued strengthening and gym program.  OBJECTIVE IMPAIRMENTS Abnormal gait, decreased knowledge of condition, decreased mobility, difficulty walking, decreased strength, increased edema, impaired flexibility, and pain.   ACTIVITY LIMITATIONS standing, squatting, stairs, transfers, and locomotion level  PARTICIPATION LIMITATIONS: shopping, community activity, occupation, and yard work  PERSONAL FACTORS Age, Behavior pattern, Fitness, Past/current experiences, and Time since onset of injury/illness/exacerbation are also affecting patient's functional outcome.   REHAB POTENTIAL: Good  CLINICAL DECISION MAKING: Stable/uncomplicated  EVALUATION COMPLEXITY: Low   GOALS: Goals reviewed with patient? Yes  SHORT TERM GOALS: Target date: 12/19/2021  Will be compliant with appropriate progressive HEP  Baseline: Goal status: MET 12/19/21  2.  Pain in R knee to be no more than 6/10 at worst  Baseline: 9/10 at worst at eval  Goal status: MET 12/19/21  3.  Will be able to perform SLR without quad lag  Baseline:  Goal status: MET 12/19/21  4.  Will report no incidents of R knee buckling within the past 2 weeks  Baseline:  Goal status: Partially met (stopped wearing brace so has had 2 episodes) 12/19/21    LONG TERM GOALS: Target date: 01/16/2022   MMT to be at least 4+/5 in RLE globally  Baseline:  Goal status: MET 12/19/21  2.  Will be able to perform steps and walk unlimited distances without knee buckling or pain  Baseline:  Goal status: MET 12/19/21  3.  Pain in R knee to be no more than 2/10 at worst  Baseline:  Goal status: MET 12/19/21  4.  Will be independent in appropriate gym based exercise program to maintain functional gains and prevent recurrence of pain/functional difficulty  Baseline:  Goal status: MET 12/19/21    PLAN:  PT FREQUENCY: 1x/week  PT DURATION: 8 weeks  PLANNED INTERVENTIONS: Therapeutic exercises, Therapeutic  activity, Neuromuscular re-education, Balance training, Gait training, Patient/Family education, Self Care, Joint mobilization, Stair training, Orthotic/Fit training, Dry Needling, Electrical stimulation, Cryotherapy, Moist heat, Taping, Ultrasound, Ionotophoresis 82m/ml Dexamethasone, Manual therapy, and Re-evaluation  PLAN FOR NEXT SESSION:  d/c Pt today    SLaureen Abrahams PT, DPT 12/19/21 9:14 AM      PHYSICAL THERAPY DISCHARGE SUMMARY  Visits from Start of Care: 5  Current functional level related to goals / functional outcomes: See above   Remaining deficits: See above   Education / Equipment: HEP   Patient agrees to discharge. Patient goals were met. Patient is being discharged due to meeting the stated rehab goals.  SLaureen Abrahams PT, DPT 12/19/21 9:14 AM  [facility] HospitalPhysical Therapy [address]GO[place] [address]Phone: [phone]  Fax:  [phone]

## 2021-12-19 ENCOUNTER — Ambulatory Visit: Payer: PRIVATE HEALTH INSURANCE | Admitting: Physical Therapy

## 2021-12-19 ENCOUNTER — Encounter: Payer: Self-pay | Admitting: Physical Therapy

## 2021-12-19 DIAGNOSIS — M25561 Pain in right knee: Secondary | ICD-10-CM | POA: Diagnosis not present

## 2021-12-19 DIAGNOSIS — R2689 Other abnormalities of gait and mobility: Secondary | ICD-10-CM

## 2021-12-19 DIAGNOSIS — M6281 Muscle weakness (generalized): Secondary | ICD-10-CM

## 2021-12-19 DIAGNOSIS — G8929 Other chronic pain: Secondary | ICD-10-CM | POA: Diagnosis not present

## 2021-12-26 ENCOUNTER — Encounter: Payer: PRIVATE HEALTH INSURANCE | Admitting: Physical Therapy

## 2021-12-31 ENCOUNTER — Encounter: Payer: PRIVATE HEALTH INSURANCE | Admitting: Physical Therapy

## 2022-02-18 ENCOUNTER — Other Ambulatory Visit: Payer: Self-pay | Admitting: Physician Assistant

## 2022-03-12 ENCOUNTER — Encounter: Payer: Self-pay | Admitting: Nurse Practitioner

## 2022-03-12 ENCOUNTER — Ambulatory Visit (INDEPENDENT_AMBULATORY_CARE_PROVIDER_SITE_OTHER): Payer: 59 | Admitting: Nurse Practitioner

## 2022-03-12 VITALS — BP 110/70 | HR 88 | Temp 98.5°F | Ht 64.0 in | Wt 168.8 lb

## 2022-03-12 DIAGNOSIS — Z0001 Encounter for general adult medical examination with abnormal findings: Secondary | ICD-10-CM

## 2022-03-12 DIAGNOSIS — J309 Allergic rhinitis, unspecified: Secondary | ICD-10-CM | POA: Diagnosis not present

## 2022-03-12 LAB — CBC
HCT: 39.8 % (ref 36.0–46.0)
Hemoglobin: 13.4 g/dL (ref 12.0–15.0)
MCHC: 33.7 g/dL (ref 30.0–36.0)
MCV: 84.1 fl (ref 78.0–100.0)
Platelets: 201 10*3/uL (ref 150.0–400.0)
RBC: 4.74 Mil/uL (ref 3.87–5.11)
RDW: 14.1 % (ref 11.5–15.5)
WBC: 5.6 10*3/uL (ref 4.0–10.5)

## 2022-03-12 LAB — COMPREHENSIVE METABOLIC PANEL
ALT: 10 U/L (ref 0–35)
AST: 14 U/L (ref 0–37)
Albumin: 4.3 g/dL (ref 3.5–5.2)
Alkaline Phosphatase: 36 U/L — ABNORMAL LOW (ref 39–117)
BUN: 12 mg/dL (ref 6–23)
CO2: 27 mEq/L (ref 19–32)
Calcium: 9 mg/dL (ref 8.4–10.5)
Chloride: 102 mEq/L (ref 96–112)
Creatinine, Ser: 0.77 mg/dL (ref 0.40–1.20)
GFR: 108.46 mL/min (ref >60.00)
Glucose, Bld: 69 mg/dL — ABNORMAL LOW (ref 70–99)
Potassium: 3.6 mEq/L (ref 3.5–5.1)
Sodium: 137 mEq/L (ref 135–145)
Total Bilirubin: 0.6 mg/dL (ref 0.2–1.2)
Total Protein: 7.3 g/dL (ref 6.0–8.3)

## 2022-03-12 MED ORDER — MONTELUKAST SODIUM 10 MG PO TABS: 10.0000 mg | ORAL_TABLET | Freq: Every day | ORAL | 3 refills | Status: DC

## 2022-03-12 MED ORDER — FLUTICASONE PROPIONATE 50 MCG/ACT NA SUSP: 2.0000 | Freq: Every day | NASAL | 11 refills | Status: DC

## 2022-03-12 NOTE — Patient Instructions (Signed)
Ok to switch claritin to zrytec or allegra or xyzal For constipation, switch from colace to miralax or magnesium or senna. Maintain high fiber diet, adequate oral hydration and daily exercise to help with constipation.  Go to lab  Preventive Care 24-24 Years Old, Female Preventive care refers to lifestyle choices and visits with your health care provider that can promote health and wellness. Preventive care visits are also called wellness exams. What can I expect for my preventive care visit? Counseling During your preventive care visit, your health care provider may ask about your: Medical history, including: Past medical problems. Family medical history. Pregnancy history. Current health, including: Menstrual cycle. Method of birth control. Emotional well-being. Home life and relationship well-being. Sexual activity and sexual health. Lifestyle, including: Alcohol, nicotine or tobacco, and drug use. Access to firearms. Diet, exercise, and sleep habits. Work and work Statistician. Sunscreen use. Safety issues such as seatbelt and bike helmet use. Physical exam Your health care provider may check your: Height and weight. These may be used to calculate your BMI (body mass index). BMI is a measurement that tells if you are at a healthy weight. Waist circumference. This measures the distance around your waistline. This measurement also tells if you are at a healthy weight and may help predict your risk of certain diseases, such as type 2 diabetes and high blood pressure. Heart rate and blood pressure. Body temperature. Skin for abnormal spots. What immunizations do I need?  Vaccines are usually given at various ages, according to a schedule. Your health care provider will recommend vaccines for you based on your age, medical history, and lifestyle or other factors, such as travel or where you work. What tests do I need? Screening Your health care provider may recommend screening  tests for certain conditions. This may include: Pelvic exam and Pap test. Lipid and cholesterol levels. Diabetes screening. This is done by checking your blood sugar (glucose) after you have not eaten for a while (fasting). Hepatitis B test. Hepatitis C test. HIV (human immunodeficiency virus) test. STI (sexually transmitted infection) testing, if you are at risk. BRCA-related cancer screening. This may be done if you have a family history of breast, ovarian, tubal, or peritoneal cancers. Talk with your health care provider about your test results, treatment options, and if necessary, the need for more tests. Follow these instructions at home: Eating and drinking  Eat a healthy diet that includes fresh fruits and vegetables, whole grains, lean protein, and low-fat dairy products. Take vitamin and mineral supplements as recommended by your health care provider. Do not drink alcohol if: Your health care provider tells you not to drink. You are pregnant, may be pregnant, or are planning to become pregnant. If you drink alcohol: Limit how much you have to 0-1 drink a day. Know how much alcohol is in your drink. In the [place] one drink equals one 12 oz bottle of beer (355 mL), one 5 oz glass of wine (148 mL), or one 1 oz glass of hard liquor (44 mL). Lifestyle Brush your teeth every morning and night with fluoride toothpaste. Floss one time each day. Exercise for at least 30 minutes 5 or more days each week. Do not use any products that contain nicotine or tobacco. These products include cigarettes, chewing tobacco, and vaping devices, such as e-cigarettes. If you need help quitting, ask your health care provider. Do not use drugs. If you are sexually active, practice safe sex. Use a condom or other form of protection to  prevent STIs. If you do not wish to become pregnant, use a form of birth control. If you plan to become pregnant, see your health care provider for a prepregnancy visit. Find  healthy ways to manage stress, such as: Meditation, yoga, or listening to music. Journaling. Talking to a trusted person. Spending time with friends and family. Minimize exposure to UV radiation to reduce your risk of skin cancer. Safety Always wear your seat belt while driving or riding in a vehicle. Do not drive: If you have been drinking alcohol. Do not ride with someone who has been drinking. If you have been using any mind-altering substances or drugs. While texting. When you are tired or distracted. Wear a helmet and other protective equipment during sports activities. If you have firearms in your house, make sure you follow all gun safety procedures. Seek help if you have been physically or sexually abused. What's next? Go to your health care provider once a year for an annual wellness visit. Ask your health care provider how often you should have your eyes and teeth checked. Stay up to date on all vaccines. This information is not intended to replace advice given to you by your health care provider. Make sure you discuss any questions you have with your health care provider. Document Revised: 08/16/2020 Document Reviewed: 08/16/2020 Elsevier Patient Education  [place].

## 2022-03-12 NOTE — Progress Notes (Unsigned)
Complete physical exam  Patient: Carla Wang   DOB: [birth year]   21 y.o. Female  MRN: [id number] Visit Date: 03/12/2022  Subjective:    Chief Complaint  Patient presents with   physical    No fasting. No concerns.    Carla Wang is a 22 y.o. female who presents today for a complete physical exam. She reports consuming a general diet.  Not consistent  She generally feels well. She reports sleeping well. She does not have additional problems to discuss today.  Vision:Yes Dental:Yes STD Screen:No  Most recent fall risk assessment:    03/12/2022    9:10 AM  [place] in the past year? 0  Number falls in past yr: 0  Injury with Fall? 0     Depression screen:{yes/no/not screened:19829}  Most recent depression screenings:    03/12/2022    9:10 AM 12/14/2021    1:25 PM  PHQ 2/9 Scores  PHQ - 2 Score 0 0    HPI  No problem-specific Assessment & Plan notes found for this encounter.   Past Medical History:  Diagnosis Date   Allergy    Past Surgical History:  Procedure Laterality Date   HAND SURGERY     INGUINAL HERNIA REPAIR     KNEE ARTHROSCOPY WITH MEDIAL MENISECTOMY Right 08/08/2021   Procedure: RIGHT KNEE ARTHROSCOPY WITH PARTIAL MEDIAL MENISECTOMY;  Surgeon: Leandrew Koyanagi, MD;  Location: [facility];  Service: Orthopedics;  Laterality: Right;   Social History   Socioeconomic History   Marital status: Single    Spouse name: Not on file   Number of children: Not on file   Years of education: Not on file   Highest education level: Not on file  Occupational History   Occupation: Ship broker   Occupation: gradutates in 2022    Employer: [employer]  Tobacco Use   Smoking status: Never   Smokeless tobacco: Never  Vaping Use   Vaping Use: Never used  Substance and Sexual Activity   Alcohol use: Yes    Comment: occas   Drug use: No   Sexual activity: Yes    Birth control/protection: Pill  Other Topics Concern   Not on file   Social History Narrative   S   Social Determinants of Health   Financial Resource Strain: Not on file  Food Insecurity: Not on file  Transportation Needs: Not on file  Physical Activity: Not on file  Stress: Not on file  Social Connections: Not on file  Intimate Partner Violence: Not on file   Family Status  Relation Name Status   Mother  Alive   Father  Alive   Brother  [place]  Alive   MGF  Alive   PGM  Alive   PGF  Alive   Neg Hx  (Not Specified)   Family History  Problem Relation Age of Onset   Diabetes Mother    Healthy Brother    Diabetes Maternal Aunt    Diabetes Maternal Grandmother    Prostate cancer Paternal Grandfather    Heart disease Neg Hx    Hyperlipidemia Neg Hx    Hypertension Neg Hx    No Known Allergies  Patient Care Team: Adasha Boehme, Charlene Brooke, NP as PCP - General (Internal Medicine) Black, Marguerita Merles (Dermatology) Eunice Blase, MD as Consulting Physician (Sports Medicine) Delsa Bern, MD as Consulting Physician (Obstetrics and Gynecology) Rozetta Nunnery,  MD (Inactive) as Consulting Physician (Otolaryngology)   Medications: Outpatient Medications Prior to Visit  Medication Sig   diclofenac (VOLTAREN) 75 MG EC tablet Take 1 tablet (75 mg total) by mouth 2 (two) times daily as needed.   docusate sodium (COLACE) 100 MG capsule Take 1 capsule (100 mg total) by mouth 2 (two) times daily.   fluticasone (FLONASE) 50 MCG/ACT nasal spray Place 2 sprays into both nostrils daily.   ibuprofen (ADVIL) 600 MG tablet Take 1 tablet (600 mg total) by mouth every 8 (eight) hours as needed (take after eating).   LO LOESTRIN FE 1 MG-10 MCG / 10 MCG tablet Take 1 tablet by mouth daily.   loratadine (CLARITIN) 10 MG tablet Take 1 tablet (10 mg total) by mouth daily.   montelukast (SINGULAIR) 10 MG tablet Take 1 tablet (10 mg total) by mouth at bedtime.   [DISCONTINUED] norethindrone-ethinyl estradiol-iron (ESTROSTEP FE,TILIA  FE,TRI-LEGEST FE) 1-20/1-30/1-35 MG-MCG tablet Take 1 tablet by mouth daily.   No facility-administered medications prior to visit.    Review of Systems  {Show previous labs (optional):23779}    Objective:  BP 110/70 (BP Location: Right Arm, Patient Position: Sitting)   Pulse 88   Temp 98.5 F (36.9 C) (Oral)   LMP 02/20/2022 (Exact Date)   SpO2 99%    Wt Readings from Last 3 Encounters:  12/14/21 169 lb 12.8 oz (77 kg)  12/05/21 169 lb (76.7 kg)  09/11/21 158 lb (71.7 kg)    Physical Exam   No results found for any visits on 03/12/22.    Assessment & Plan:    Routine Health Maintenance and Physical Exam  Immunization History  Administered Date(s) Administered   HPV 9-valent 09/27/2011, 11/29/2011, 04/01/2012   Influenza,inj,Quad PF,6+ Mos 12/01/2018, 11/29/2019, 11/29/2020, 12/05/2021   Tdap 11/29/2019    Health Maintenance  Topic Date Due   COVID-19 Vaccine (1) 03/28/2022 (Originally 12/03/2000)   PAP-Cervical Cytology Screening  07/03/2022   PAP SMEAR-Modifier  10/22/2022   DTaP/Tdap/Td (2 - Td or Tdap) 11/28/2029   INFLUENZA VACCINE  Completed   HPV VACCINES  Completed   Hepatitis C Screening  Completed   HIV Screening  Completed    Discussed health benefits of physical activity, and encouraged her to engage in regular exercise appropriate for her age and condition.  Problem List Items Addressed This Visit   None Visit Diagnoses     Encounter for preventative adult health care exam with abnormal findings    -  Primary      No follow-ups on file.     Wilfred Lacy, NP

## 2022-03-13 ENCOUNTER — Encounter: Payer: Self-pay | Admitting: Nurse Practitioner

## 2022-05-17 ENCOUNTER — Ambulatory Visit (INDEPENDENT_AMBULATORY_CARE_PROVIDER_SITE_OTHER): Payer: 59 | Admitting: Podiatry

## 2022-05-17 ENCOUNTER — Ambulatory Visit (INDEPENDENT_AMBULATORY_CARE_PROVIDER_SITE_OTHER): Payer: 59

## 2022-05-17 DIAGNOSIS — S99822A Other specified injuries of left foot, initial encounter: Secondary | ICD-10-CM | POA: Diagnosis not present

## 2022-05-17 DIAGNOSIS — M79672 Pain in left foot: Secondary | ICD-10-CM

## 2022-05-17 NOTE — Progress Notes (Signed)
   Chief Complaint  Patient presents with   Toe Injury    Patient came in today for a let hallux injury that happen a week ago, patient hit the hallux, swelling, rate of pain 3 out of 10, X-Rays done today     HPI: 22 y.o. female presenting today as a new patient for evaluation of an injury that occurred last week.  Patient states that she stubbed her toe and she noticed some bleeding coming from the nail.  Currently the pain is 3/10.  Presenting for further treatment and evaluation  Past Medical History:  Diagnosis Date   Allergy    ETD (Eustachian tube dysfunction), bilateral 11/29/2020   eval by ENT 2021    Past Surgical History:  Procedure Laterality Date   HAND SURGERY     INGUINAL HERNIA REPAIR     KNEE ARTHROSCOPY WITH MEDIAL MENISECTOMY Right 08/08/2021   Procedure: RIGHT KNEE ARTHROSCOPY WITH PARTIAL MEDIAL MENISECTOMY;  Surgeon: Leandrew Koyanagi, MD;  Location: [facility];  Service: Orthopedics;  Laterality: Right;    No Known Allergies   Physical Exam: General: The patient is alert and oriented x3 in no acute distress.  Dermatology: Subungual hematoma noted to the left hallux nail plate encompassing the majority of the nail plate.  No active bleeding noted.  Clinically no indication or evidence of infection  Vascular: Palpable pedal pulses bilaterally. Capillary refill within normal limits.  Negative for any significant edema or erythema  Neurological: Light touch and protective threshold grossly intact  Musculoskeletal Exam: No pedal deformities noted  Radiographic Exam LT foot 05/17/2022:  Normal osseous mineralization. Joint spaces preserved. No fracture/dislocation/boney destruction.    Assessment: 1.  Traumatic injury of nail plate left hallux   Plan of Care:  1. Patient evaluated. X-Rays reviewed.  2.  Due to the subungual hematoma encompassing the majority of the nail plate decision made for total temporary removal of the left hallux nail  plate.  The procedure was explained in detail including the postoperative recovery course.  The patient has had this procedure done before and understands.  She would like to proceed with nail avulsion. 3.  The nail was prepped aseptically and digital block performed using 3 mL of 2% lidocaine plain.  The nail was avulsed in its entirety and sterile dressings applied 4.  Post care instructions provided 5.  Return to clinic as needed      Edrick Kins, DPM [facility]  Dr. Edrick Kins, DPM    [address], [place] [address]                Office [phone]  Fax [phone]

## 2022-08-26 ENCOUNTER — Ambulatory Visit (INDEPENDENT_AMBULATORY_CARE_PROVIDER_SITE_OTHER): Payer: 59 | Admitting: Podiatry

## 2022-08-26 DIAGNOSIS — L600 Ingrowing nail: Secondary | ICD-10-CM

## 2022-08-26 NOTE — Progress Notes (Signed)
   Chief Complaint  Patient presents with   Nail Problem    Patient came in today for left hallux nail fungus, started a month ago, patient states the base of the nail is sore, nail is thick and black     HPI: 22 y.o. female presenting today for evaluation of tenderness associated to the left hallux nail plate.  Patient was seen on 05/17/2022 and at that time nail avulsion was performed.  She was going to the [place] and so she applied an acrylic nail to the nail plate.  She now has pain and sensitivity associated to the nail plate and acrylic nail that was applied.  She tried to remove the majority of the nail plate but she continues to have sensitivity.  Past Medical History:  Diagnosis Date   Allergy    ETD (Eustachian tube dysfunction), bilateral 11/29/2020   eval by ENT 2021    Past Surgical History:  Procedure Laterality Date   HAND SURGERY     INGUINAL HERNIA REPAIR     KNEE ARTHROSCOPY WITH MEDIAL MENISECTOMY Right 08/08/2021   Procedure: RIGHT KNEE ARTHROSCOPY WITH PARTIAL MEDIAL MENISECTOMY;  Surgeon: Tarry Kos, MD;  Location: [facility];  Service: Orthopedics;  Laterality: Right;    No Known Allergies   Physical Exam: General: The patient is alert and oriented x3 in no acute distress.  Dermatology: Dystrophic nail plate noted with portions of acrylic nail applied to the nail plate of the left hallux.  Vascular: Palpable pedal pulses bilaterally. Capillary refill within normal limits.  Negative for any significant edema or erythema  Neurological: Grossly intact via light touch  Musculoskeletal Exam: No pedal deformities noted   Assessment: 1.  injury of nail plate left hallux secondary to acrylic nail   Plan of Care:  -Patient evaluated.  -After evaluating the toenail I do believe the patient would benefit from repeat total temporary nail avulsion to allow the new nail to grow in.  There is extensive damage to the existing nail plate secondary to  the acrylic nail that was applied.  Patient agrees.  The toe was prepped in aseptic manner and digital block performed using 3 2% lidocaine plain.  The nail was avulsed in its entirety and dressings applied.  Post care instructions provided. -Return to clinic as needed     Felecia Shelling, DPM [facility]  Dr. Felecia Shelling, DPM    [address] [place], [place] [address]                Office [phone]  Fax [phone]

## 2022-10-08 ENCOUNTER — Ambulatory Visit: Payer: 59 | Admitting: Internal Medicine

## 2022-10-13 ENCOUNTER — Ambulatory Visit
Admission: EM | Admit: 2022-10-13 | Discharge: 2022-10-13 | Disposition: A | Discharge status: Home / Self Care | Payer: 59 | Attending: Internal Medicine | Admitting: Internal Medicine

## 2022-10-13 DIAGNOSIS — J069 Acute upper respiratory infection, unspecified: Secondary | ICD-10-CM

## 2022-10-13 MED ORDER — BENZONATATE 200 MG PO CAPS
200.0000 mg | ORAL_CAPSULE | Freq: Three times a day (TID) | ORAL | 0 refills | Status: DC | PRN
Start: 2022-10-13 — End: 2023-03-14

## 2022-10-13 NOTE — ED Provider Notes (Signed)
[facility]    CSN: [id number] Arrival date & time: 10/13/22  1313      History   Chief Complaint Chief Complaint  Patient presents with   Otalgia    HPI Carla Wang is a 22 y.o. female  presents for evaluation of URI symptoms for 2 days. Patient reports associated symptoms of cough, scratchy throat, congestion, headache, right ear pain. Denies N/V/D, fevers, sore throat, body aches, shortness of breath. Patient does not have a hx of asthma or smoking. No known sick contacts.  Pt has taken allergy medicine and Flonase OTC for symptoms. Pt has no other concerns at this time.    Otalgia Associated symptoms: congestion, cough and headaches     Past Medical History:  Diagnosis Date   Allergy    ETD (Eustachian tube dysfunction), bilateral 11/29/2020   eval by ENT 2021    Patient Active Problem List   Diagnosis Date Noted   Acute non-recurrent pansinusitis 12/14/2021   Constipation 12/05/2021   Patellofemoral dysfunction of right knee 08/08/2021   Synovial plica of right knee 08/08/2021   Acute medial meniscus tear of right knee 05/30/2021   Chronic allergic rhinitis 11/29/2019   Acne vulgaris 10/21/2017   Seborrheic dermatitis 10/21/2017   Eczema 10/21/2017   Dysmenorrhea 10/20/2015   Scoliosis 09/08/2014    Past Surgical History:  Procedure Laterality Date   HAND SURGERY     INGUINAL HERNIA REPAIR     KNEE ARTHROSCOPY WITH MEDIAL MENISECTOMY Right 08/08/2021   Procedure: RIGHT KNEE ARTHROSCOPY WITH PARTIAL MEDIAL MENISECTOMY;  Surgeon: Tarry Kos, MD;  Location: [facility];  Service: Orthopedics;  Laterality: Right;    OB History   No obstetric history on file.      Home Medications    Prior to Admission medications   Medication Sig Start Date End Date Taking? Authorizing Provider  benzonatate (TESSALON) 200 MG capsule Take 1 capsule (200 mg total) by mouth 3 (three) times daily as needed. 10/13/22  Yes Radford Pax, NP   cetirizine (ZYRTEC) 10 MG tablet Take 10 mg by mouth daily.   Yes [provider]  diclofenac (VOLTAREN) 75 MG EC tablet Take 1 tablet (75 mg total) by mouth 2 (two) times daily as needed. 11/13/21   Cristie Hem, PA-C  fluticasone (FLONASE) 50 MCG/ACT nasal spray Place 2 sprays into both nostrils daily. 03/12/22   Nche, Bonna Gains, NP  ibuprofen (ADVIL) 600 MG tablet Take 1 tablet (600 mg total) by mouth every 8 (eight) hours as needed (take after eating). 12/07/20   Hudnell, Judeth Cornfield, NP  LO LOESTRIN FE 1 MG-10 MCG / 10 MCG tablet Take 1 tablet by mouth daily. 12/08/21   [provider]  montelukast (SINGULAIR) 10 MG tablet Take 1 tablet (10 mg total) by mouth at bedtime. 03/12/22   Nche, Bonna Gains, NP    Family History Family History  Problem Relation Age of Onset   Diabetes Mother    Healthy Brother    Diabetes Maternal Aunt    Diabetes Maternal Grandmother    Prostate cancer Paternal Grandfather    Heart disease Neg Hx    Hyperlipidemia Neg Hx    Hypertension Neg Hx     Social History Social History   Tobacco Use   Smoking status: Never   Smokeless tobacco: Never  Vaping Use   Vaping status: Never Used  Substance Use Topics   Alcohol use: Yes    Comment: occas  Drug use: No     Allergies   Patient has no known allergies.   Review of Systems Review of Systems  HENT:  Positive for congestion and ear pain.        Scratchy throat  Respiratory:  Positive for cough.   Neurological:  Positive for headaches.     Physical Exam Triage Vital Signs ED Triage Vitals  Encounter Vitals Group     BP 10/13/22 1320 134/85     Systolic BP Percentile --      Diastolic BP Percentile --      Pulse Rate 10/13/22 1320 85     Resp 10/13/22 1320 16     Temp 10/13/22 1320 98.1 F (36.7 C)     Temp Source 10/13/22 1320 Oral     SpO2 10/13/22 1320 98 %     Weight --      Height --      Head Circumference --      Peak Flow --      Pain Score 10/13/22  1323 3     Pain Loc --      Pain Education --      Exclude from Growth Chart --    No data found.  Updated Vital Signs BP 134/85 (BP Location: Left Arm)   Pulse 85   Temp 98.1 F (36.7 C) (Oral)   Resp 16   LMP 09/30/2022 (Exact Date)   SpO2 98%   Visual Acuity Right Eye Distance:   Left Eye Distance:   Bilateral Distance:    Right Eye Near:   Left Eye Near:    Bilateral Near:     Physical Exam Vitals and nursing note reviewed.  Constitutional:      General: She is not in acute distress.    Appearance: Normal appearance. She is well-developed. She is not ill-appearing.  HENT:     Head: Normocephalic and atraumatic.     Right Ear: Tympanic membrane and ear canal normal.     Left Ear: Tympanic membrane and ear canal normal.     Nose: Congestion present.     Mouth/Throat:     Mouth: Mucous membranes are moist.     Pharynx: Oropharynx is clear. Uvula midline. No oropharyngeal exudate or posterior oropharyngeal erythema.     Tonsils: No tonsillar exudate or tonsillar abscesses.  Eyes:     Conjunctiva/sclera: Conjunctivae normal.     Pupils: Pupils are equal, round, and reactive to light.  Cardiovascular:     Rate and Rhythm: Normal rate and regular rhythm.     Heart sounds: Normal heart sounds.  Pulmonary:     Effort: Pulmonary effort is normal.     Breath sounds: Normal breath sounds.  Musculoskeletal:     Cervical back: Normal range of motion and neck supple.  Lymphadenopathy:     Cervical: No cervical adenopathy.  Skin:    General: Skin is warm and dry.  Neurological:     General: No focal deficit present.     Mental Status: She is alert and oriented to person, place, and time.  Psychiatric:        Mood and Affect: Mood normal.        Behavior: Behavior normal.      UC Treatments / Results  Labs (all labs ordered are listed, but only abnormal results are displayed) Labs Reviewed - No data to display  EKG   Radiology No results  found.  Procedures Procedures (including critical care time)  Medications Ordered in UC Medications - No data to display  Initial Impression / Assessment and Plan / UC Course  I have reviewed the triage vital signs and the nursing notes.  Pertinent labs & imaging results that were available during my care of the patient were reviewed by me and considered in my medical decision making (see chart for details).     Reviewed exam and symptoms with patient.  No red flags.  Patient declined COVID testing.  Discussed viral illness and symptomatic treatment.  Continue allergy medicine and Flonase.  Trial of Tessalon for cough.  Lots of rest and fluids.  PCP follow-up as symptoms do not improve.  ER precautions reviewed and patient verbalized understanding. Final Clinical Impressions(s) / UC Diagnoses   Final diagnoses:  Viral upper respiratory illness     Discharge Instructions       Please treat your symptoms with over the counter tylenol or ibuprofen, humidifier, and rest.  Tessalon as needed for cough.  Please continue your allergy medicine and Flonase.  Viral illnesses can last 7-14 days. Please follow up with your PCP if your symptoms are not improving. Please go to the ER for any worsening symptoms. This includes but is not limited to fever you can not control with tylenol or ibuprofen, you are not able to stay hydrated, you have shortness of breath or chest pain.  Thank you for choosing [facility] for your healthcare needs. I hope you feel better soon!     ED Prescriptions     Medication Sig Dispense Auth. Provider   benzonatate (TESSALON) 200 MG capsule Take 1 capsule (200 mg total) by mouth 3 (three) times daily as needed. 20 capsule Radford Pax, NP      PDMP not reviewed this encounter.   Radford Pax, NP 10/13/22 [phone]

## 2022-10-13 NOTE — ED Triage Notes (Signed)
Pt reports itchy throat, right ear pain x 2 days; headache and chest congestion started this morning. +

## 2022-10-13 NOTE — Discharge Instructions (Signed)
Please treat your symptoms with over the counter tylenol or ibuprofen, humidifier, and rest.  Tessalon as needed for cough.  Please continue your allergy medicine and Flonase.  Viral illnesses can last 7-14 days. Please follow up with your PCP if your symptoms are not improving. Please go to the ER for any worsening symptoms. This includes but is not limited to fever you can not control with tylenol or ibuprofen, you are not able to stay hydrated, you have shortness of breath or chest pain.  Thank you for choosing [facility] for your healthcare needs. I hope you feel better soon!

## 2022-10-14 ENCOUNTER — Ambulatory Visit: Payer: 59 | Admitting: Internal Medicine

## 2022-10-14 ENCOUNTER — Encounter: Payer: Self-pay | Admitting: Internal Medicine

## 2022-10-14 VITALS — BP 104/80 | HR 85 | Temp 98.2°F | Ht 64.0 in | Wt 173.0 lb

## 2022-10-14 DIAGNOSIS — S66912A Strain of unspecified muscle, fascia and tendon at wrist and hand level, left hand, initial encounter: Secondary | ICD-10-CM

## 2022-10-14 MED ORDER — IBUPROFEN 800 MG PO TABS
800.0000 mg | ORAL_TABLET | Freq: Three times a day (TID) | ORAL | 0 refills | Status: DC | PRN
Start: 2022-10-14 — End: 2023-04-30

## 2022-10-14 NOTE — Patient Instructions (Signed)
Use ice packs in 20 minute increments Rest, no heavy lifting

## 2022-10-14 NOTE — Progress Notes (Signed)
[facility] [address] [place] [address] Dept: [phone] Dept Fax: [phone]  Acute Care Office Visit  Subjective:   Carla Wang August 24, 2000 10/14/2022  Chief Complaint  Patient presents with   Hand Pain    Left hand pain started 2 1/2 weeks ago taking ibuprofen     HPI: Carla Wang is a 22 year old female who complains of left hand pain onset 2 weeks ago.  Patient denies any recent injuries or falls.  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  She has taken some ibuprofen for the pain occasionally, which does seem to help alleviate the pain. No numbness or tingling.  No decreased grip strength.    The following portions of the patient's history were reviewed and updated as appropriate: past medical history, past surgical history, family history, social history, allergies, medications, and problem list.   Patient Active Problem List   Diagnosis Date Noted   Acute non-recurrent pansinusitis 12/14/2021   Constipation 12/05/2021   Patellofemoral dysfunction of right knee 08/08/2021   Synovial plica of right knee 08/08/2021   Acute medial meniscus tear of right knee 05/30/2021   Chronic allergic rhinitis 11/29/2019   Acne vulgaris 10/21/2017   Seborrheic dermatitis 10/21/2017   Eczema 10/21/2017   Dysmenorrhea 10/20/2015   Scoliosis 09/08/2014   Past Medical History:  Diagnosis Date   Allergy    ETD (Eustachian tube dysfunction), bilateral 11/29/2020   eval by ENT 2021   Past Surgical History:  Procedure Laterality Date   HAND SURGERY     INGUINAL HERNIA REPAIR     KNEE ARTHROSCOPY WITH MEDIAL MENISECTOMY Right 08/08/2021   Procedure: RIGHT KNEE ARTHROSCOPY WITH PARTIAL MEDIAL MENISECTOMY;  Surgeon: Tarry Kos, MD;  Location: [facility];  Service:  Orthopedics;  Laterality: Right;   Family History  Problem Relation Age of Onset   Diabetes Mother    Healthy Brother    Diabetes Maternal Aunt    Diabetes Maternal Grandmother    Prostate cancer Paternal Grandfather    Heart disease Neg Hx    Hyperlipidemia Neg Hx    Hypertension Neg Hx    Outpatient Medications Prior to Visit  Medication Sig Dispense Refill   cetirizine (ZYRTEC) 10 MG tablet Take 10 mg by mouth daily.     fluticasone (FLONASE) 50 MCG/ACT nasal spray Place 2 sprays into both nostrils daily. 16 g 11   LO LOESTRIN FE 1 MG-10 MCG / 10 MCG tablet Take 1 tablet by mouth daily.     montelukast (SINGULAIR) 10 MG tablet Take 1 tablet (10 mg total) by mouth at bedtime. 90 tablet 3   ibuprofen (ADVIL) 600 MG tablet Take 1 tablet (600 mg total) by mouth every 8 (eight) hours as needed (take after eating). 30 tablet 0   benzonatate (TESSALON) 200 MG capsule Take 1 capsule (200 mg total) by mouth 3 (three) times daily as needed. (Patient not taking: Reported on 10/14/2022) 20 capsule 0   diclofenac (VOLTAREN) 75 MG EC tablet Take 1 tablet (75 mg total) by mouth 2 (two) times daily as needed. (Patient not taking: Reported on 10/14/2022) 60 tablet 2   No facility-administered medications prior to visit.   No Known Allergies   ROS: A complete ROS was performed with pertinent positives/negatives noted in the HPI. The remainder  of the ROS are negative.    Objective:   Today's Vitals   10/14/22 1535  BP: 104/80  Pulse: 85  Temp: 98.2 F (36.8 C)  TempSrc: Temporal  SpO2: 99%  Weight: 173 lb (78.5 kg)  Height: 5\' 4"  (1.626 m)    GENERAL: Well-appearing, in NAD. Well nourished.  SKIN: Pink, warm and dry.  CARDIAC: Peripheral pulses 2+ bilaterally.  MSK: Muscle tone and strength appropriate for age. TTP left MCP. No ecchymosis or large amount of swelling. FROM wrist, hand, fingers.  EXTREMITIES: Without clubbing, cyanosis, or edema.  NEUROLOGIC: No motor or sensory  deficits. Steady, even gait.  PSYCH/MENTAL STATUS: Alert, oriented x 3. Cooperative, appropriate mood and affect.    No results found for any visits on 10/14/22.    Assessment & Plan:  1. Strain of left hand, initial encounter - ibuprofen (ADVIL) 800 MG tablet; Take 1 tablet (800 mg total) by mouth every 8 (eight) hours as needed (pain).  Dispense: 30 tablet; Refill: 0 - discussed ibuprofen 800mg  TID consistently x 5 days , with food.  - ice in 20 minute increments   Meds ordered this encounter  Medications   ibuprofen (ADVIL) 800 MG tablet    Sig: Take 1 tablet (800 mg total) by mouth every 8 (eight) hours as needed (pain).    Dispense:  30 tablet    Refill:  0    Order Specific Question:   Supervising Provider    Answer:   Garnette Gunner [[id number]]   Lab Orders  No laboratory test(s) ordered today   No images are attached to the encounter or orders placed in the encounter.  Return if symptoms worsen or fail to improve.   Of note, portions of this note may have been created with voice recognition software Physicist, medical). While this note has been edited for accuracy, occasional wrong-word or 'sound-a-like' substitutions may have occurred due to the inherent limitations of voice recognition software.  Salvatore Decent, FNP

## 2022-10-22 LAB — HM PAP SMEAR

## 2022-12-30 ENCOUNTER — Ambulatory Visit (INDEPENDENT_AMBULATORY_CARE_PROVIDER_SITE_OTHER): Payer: 59 | Admitting: Nurse Practitioner

## 2022-12-30 VITALS — BP 125/82 | HR 75 | Temp 99.2°F | Resp 18 | Wt 169.0 lb

## 2022-12-30 DIAGNOSIS — J060 Acute laryngopharyngitis: Secondary | ICD-10-CM

## 2022-12-30 LAB — POCT INFLUENZA A/B
Influenza A, POC: NEGATIVE
Influenza B, POC: NEGATIVE

## 2022-12-30 LAB — POC COVID19 BINAXNOW: SARS Coronavirus 2 Ag: NEGATIVE

## 2022-12-30 NOTE — Patient Instructions (Addendum)
Negative COVID and Flu test.  Encourage adequate oral hydration. May add warm/hot drinks. Use over-the-counter  cold" medicine  such as Dayquil/nyquil for cough and congestion.  Use mucinex DM or Robitussin  or delsym for cough without congestion  You can use plain "Tylenol" or "Advil" for fever, chills and achyness. Use cool mist humidifier at bedtime to help with nasal congestion and cough.  Cold/cough medications may have tylenol or ibuprofen or guaifenesin or dextromethophan in them, so be careful not to take beyond the recommended dose for each of these medications.   "Common cold" symptoms are usually triggered by a virus.  The antibiotics are usually not necessary. On average, a" viral cold" illness may take 7-10 days to resolve. Please, make an appointment if you are not better or if you're worse.

## 2022-12-30 NOTE — Progress Notes (Signed)
Established Patient Visit  Patient: Carla Wang   DOB: Jan 03, 2001   22 y.o. Female  MRN: [id number] Visit Date: 12/30/2022  Subjective:    Chief Complaint  Patient presents with   Acute Visit    PT is here for sore throat, nasal congestion, with cough for 6 days. PT took OTC medication day and night quill.    URI  This is a new problem. The current episode started in the past 7 days. The problem has been unchanged. The maximum temperature recorded prior to her arrival was 100.4 - 100.9 F. Associated symptoms include congestion, coughing, headaches, joint pain, rhinorrhea, sinus pain, sneezing and a sore throat. Pertinent negatives include no abdominal pain, chest pain, diarrhea, dysuria, ear pain, joint swelling, nausea, neck pain, plugged ear sensation, rash, swollen glands, vomiting or wheezing. She has tried acetaminophen and decongestant for the symptoms. The treatment provided mild relief.   Reviewed medical, surgical, and social history today  Medications: Outpatient Medications Prior to Visit  Medication Sig   benzonatate (TESSALON) 200 MG capsule Take 1 capsule (200 mg total) by mouth 3 (three) times daily as needed.   cetirizine (ZYRTEC) 10 MG tablet Take 10 mg by mouth daily.   fluticasone (FLONASE) 50 MCG/ACT nasal spray Place 2 sprays into both nostrils daily.   ibuprofen (ADVIL) 800 MG tablet Take 1 tablet (800 mg total) by mouth every 8 (eight) hours as needed (pain).   LO LOESTRIN FE 1 MG-10 MCG / 10 MCG tablet Take 1 tablet by mouth daily.   montelukast (SINGULAIR) 10 MG tablet Take 1 tablet (10 mg total) by mouth at bedtime.   No facility-administered medications prior to visit.   Reviewed past medical and social history.   ROS per HPI above      Objective:  BP 125/82 (BP Location: Left Arm, Patient Position: Sitting, Cuff Size: Normal)   Pulse 75   Temp 99.2 F (37.3 C) (Oral)   Resp 18   Wt 169 lb (76.7 kg)   SpO2 100%   BMI 29.01  kg/m      Physical Exam Constitutional:      General: She is not in acute distress. HENT:     Right Ear: Tympanic membrane, ear canal and external ear normal.     Left Ear: Tympanic membrane, ear canal and external ear normal.     Nose: Congestion and rhinorrhea present. No nasal tenderness or mucosal edema.     Right Nostril: No occlusion.     Left Nostril: No occlusion.     Right Turbinates: Not enlarged, swollen or pale.     Left Turbinates: Not enlarged, swollen or pale.     Right Sinus: No maxillary sinus tenderness or frontal sinus tenderness.     Left Sinus: No maxillary sinus tenderness or frontal sinus tenderness.     Mouth/Throat:     Pharynx: Oropharynx is clear. Uvula midline. Posterior oropharyngeal erythema present. No oropharyngeal exudate.     Tonsils: No tonsillar exudate or tonsillar abscesses.  Eyes:     Extraocular Movements: Extraocular movements intact.     Conjunctiva/sclera: Conjunctivae normal.  Cardiovascular:     Rate and Rhythm: Normal rate and regular rhythm.     Pulses: Normal pulses.     Heart sounds: Normal heart sounds.  Pulmonary:     Effort: Pulmonary effort is normal.     Breath sounds: Normal breath sounds.  Musculoskeletal:     Cervical back: Normal range of motion and neck supple.  Lymphadenopathy:     Cervical: No cervical adenopathy.  Skin:    Findings: No rash.  Neurological:     Mental Status: She is alert and oriented to person, place, and time.     Results for orders placed or performed in visit on 12/30/22  POCT Influenza A/B  Result Value Ref Range   Influenza A, POC Negative Negative   Influenza B, POC Negative Negative  POC COVID-19 BinaxNow  Result Value Ref Range   SARS Coronavirus 2 Ag Negative Negative      Assessment & Plan:    Problem List Items Addressed This Visit   None Visit Diagnoses     Acute laryngopharyngitis    -  Primary   Relevant Orders   POCT Influenza A/B (Completed)   POC COVID-19  BinaxNow (Completed)     Encourage adequate oral hydration. May add warm/hot drinks. Use over-the-counter  cold" medicine  such as Dayquil/nyquil for cough and congestion.  Use mucinex DM or Robitussin  or delsym for cough without congestion  You can use plain "Tylenol" or "Advil" for fever, chills and achyness. Use cool mist humidifier at bedtime to help with nasal congestion and cough.  Return in about 3 months (around 04/01/2023) for CPE (fasting).     Alysia Penna, NP

## 2023-01-01 ENCOUNTER — Encounter: Payer: Self-pay | Admitting: Nurse Practitioner

## 2023-01-11 IMAGING — MR MR KNEE*R* W/O CM
4 of 7 series · 21 of 40 positions shown · non-contrast
Comparison: Right knee radiographs 05/11/2021

CLINICAL DATA: Chronic knee pain. Medial right knee pain off and on
since 5541.

EXAM:
MRI OF THE RIGHT KNEE WITHOUT CONTRAST
TECHNIQUE: Multiplanar, multisequence MR imaging of the knee was performed. No
intravenous contrast was administered.

[Series 3: T2 fat-sat · axial · 4.0mm · 0.50mm/px · z∈[-65,+60]mm · 5 of 26 slices shown]
[im 1/26]
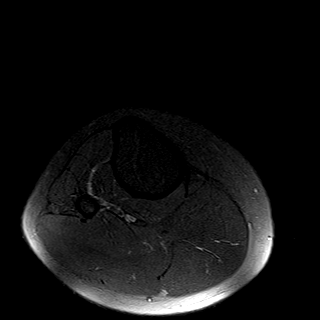
[im 6/26]
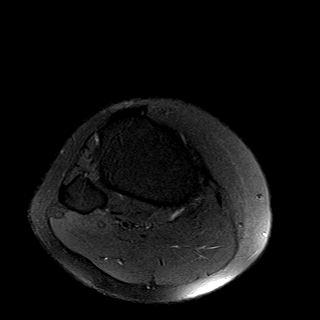
[im 11/26]
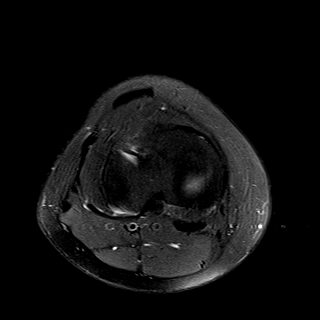
[im 16/26]
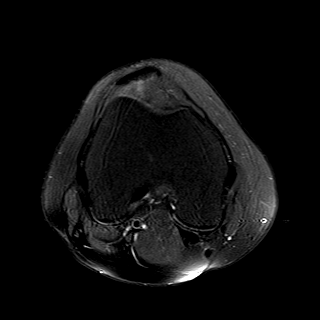
[im 26/26]
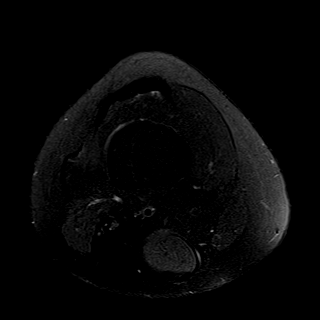

[Series 7: PD fat-sat · sagittal · 3.0mm · 0.29mm/px · 6 of 25 slices shown (1 of 3)]
[im 1/25]
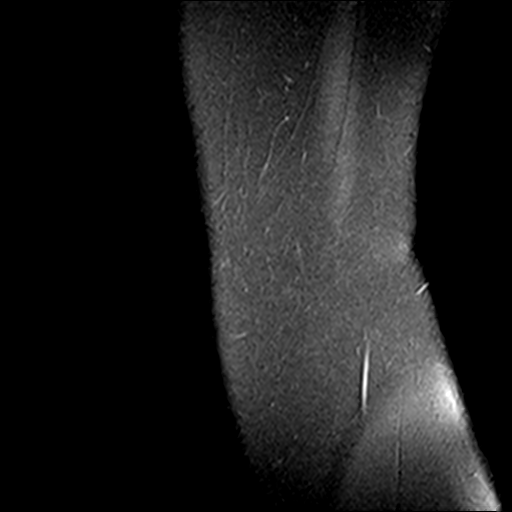
[im 5/25]
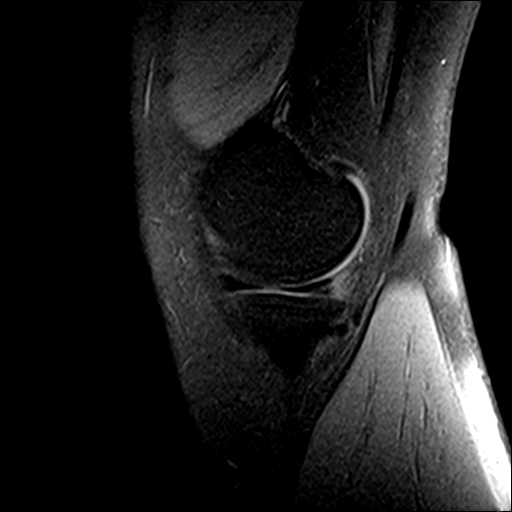
[im 10/25]
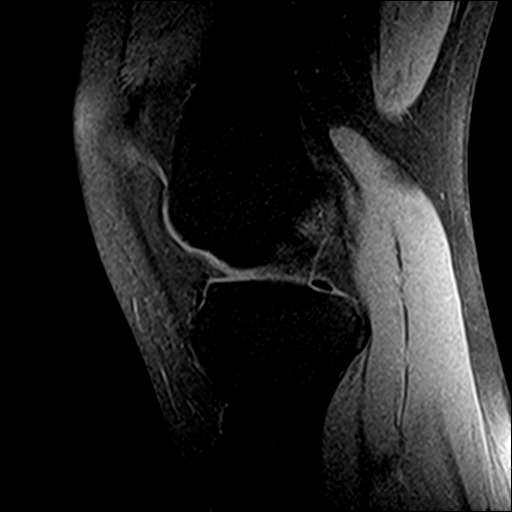
[im 15/25]
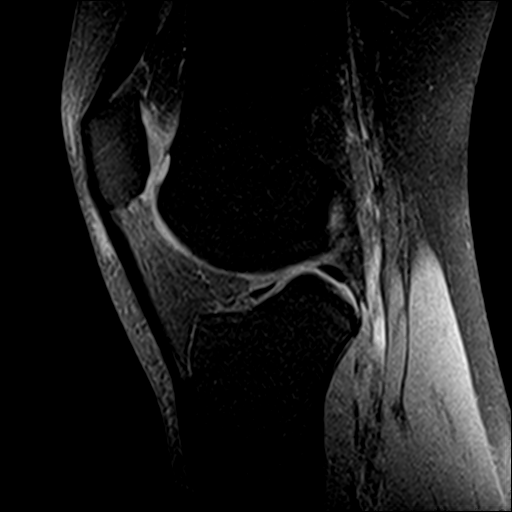
[im 20/25]
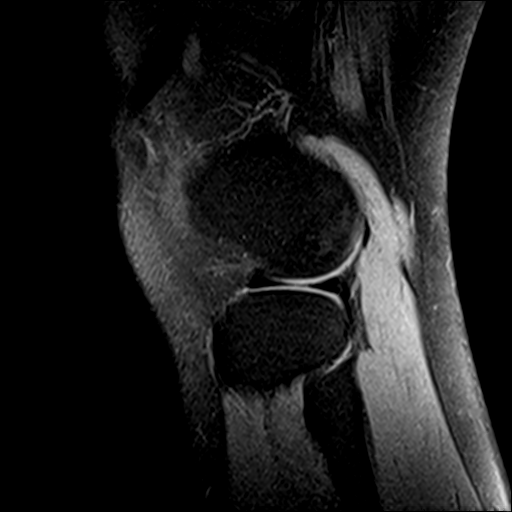
[im 25/25]
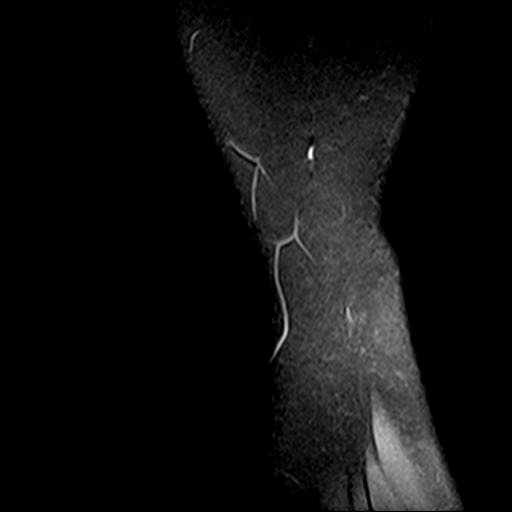

[Series 8: PD fat-sat · coronal · 3.0mm · 0.29mm/px · 7 of 27 slices shown (2 of 3)]
[im 1/27]
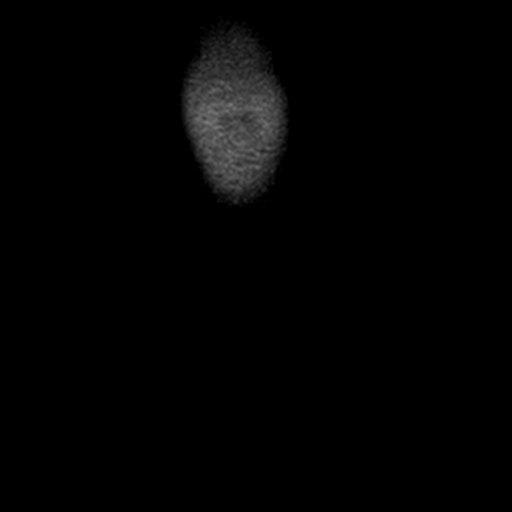
[im 5/27]
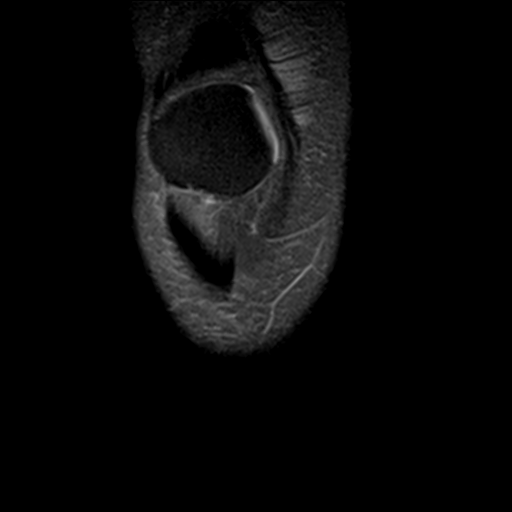
[im 9/27]
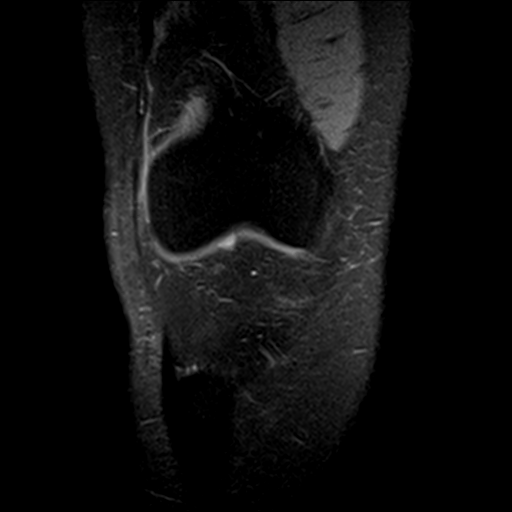
[im 14/27]
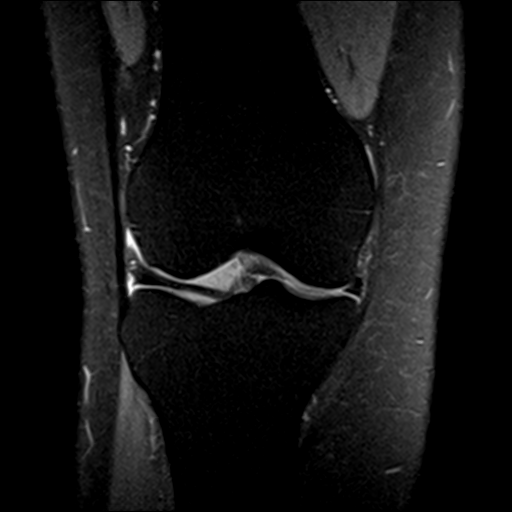
[im 18/27]
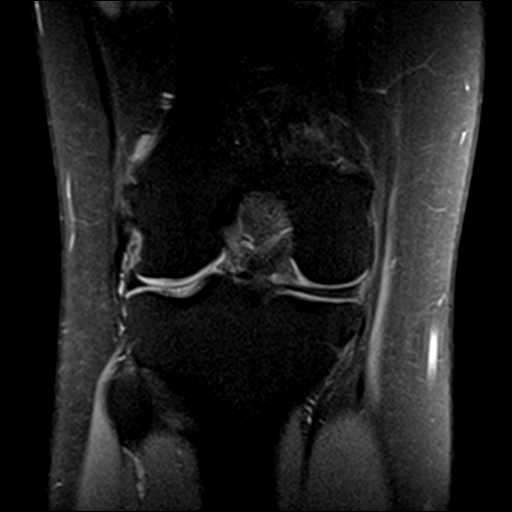
[im 22/27]
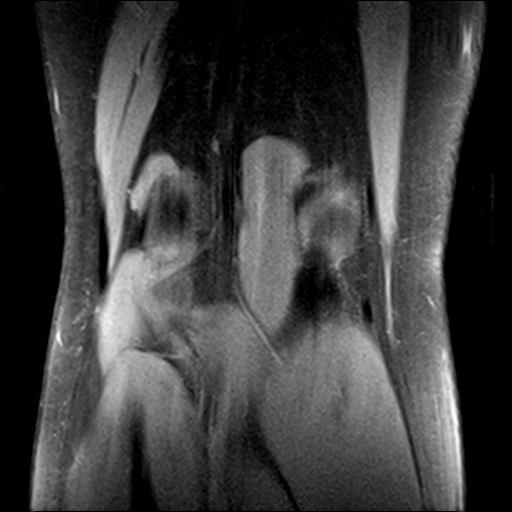
[im 27/27]
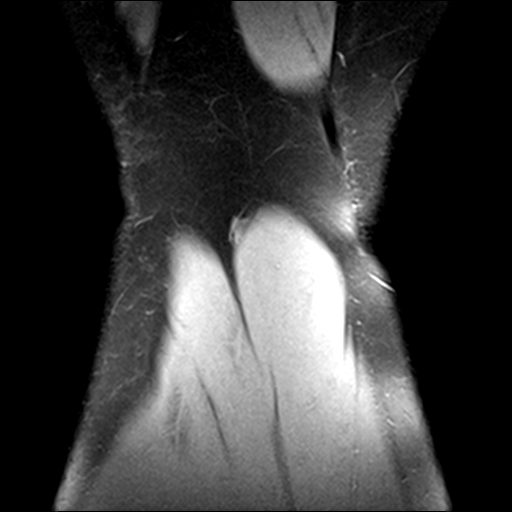

[Series 9: PD fat-sat · oblique · 2.3mm · 0.29mm/px · 3 of 10 slices shown (3 of 3)]
[im 1/10]
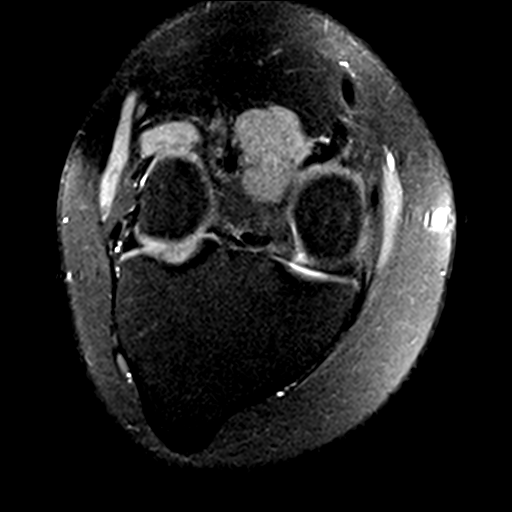
[im 5/10]
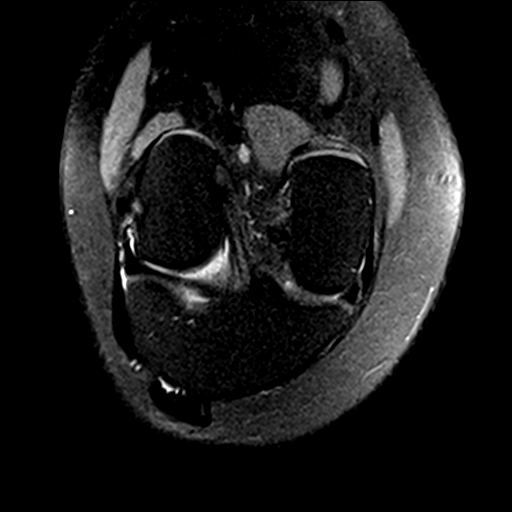
[im 10/10]
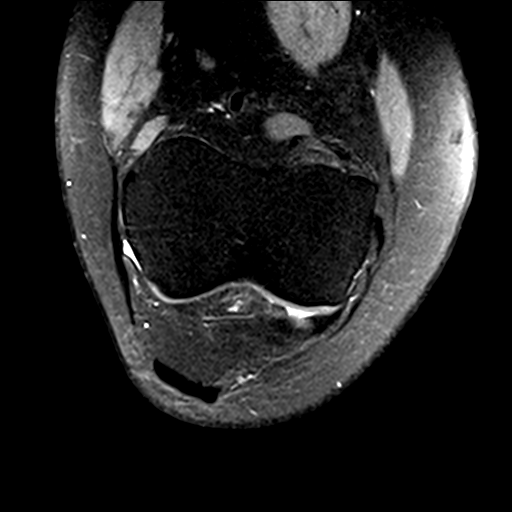

[21 of 40 positions shown; findings below may reference images not displayed]

FINDINGS: MENISCI

Medial meniscus: There is mild intermediate proton density signal
intrasubstance degeneration throughout the posterior horn of the
medial meniscus. There is more linear intermediate proton density
signal that extends from the posterior wall through the inferior
articular surface of the middle of the central thirds of the
nondisplaced oblique undersurface tear.

Lateral meniscus: There is intermediate proton density signal
throughout the superior 50% of the anterior horn of the lateral
meniscus, intrasubstance degeneration (sagittal series 7, images 5
through 9). There is also minimal intermediate signal degenerative
change of the superior aspect of the free edge of the mid AP
dimension of the body of the lateral meniscus (coronal image 11).
Otherwise, no tear is seen extending through an articular surface of
the lateral meniscus.

LIGAMENTS

Cruciates: The ACL and PCL are intact.

Collaterals: The medial collateral ligament is intact. The fibular
collateral ligament, biceps femoris tendon, iliotibial band, and
popliteus tendon are intact.

CARTILAGE

Patellofemoral:  Intact.

Medial:  Intact.

Lateral:  Intact.

Joint: Nojoint effusion. There is mild-to-moderate edema within the
superolateral aspect of Hoffa's fat pad as can be seen with
infrapatellar fat pad impingement. The tibial tuberosity-trochlear
groove distance measures 20 mm, abnormally increased.

Popliteal Fossa:  No Baker's cyst.

Extensor Mechanism:  Intact quadriceps tendon and patellar tendon.

Bones:  No acute fracture or dislocation.

Other: None.
IMPRESSION: :
IMPRESSION: 1. Small nondisplaced oblique undersurface tear within the far
medial aspect of the posterior horn of the medial meniscus (sagittal
image 21).
2. Intrasubstance degeneration throughout the superior aspect of the
anterior horn of the lateral meniscus and focally within the free
edge of the body of the lateral meniscus.
3. Increased tibial tuberosity-trochlear groove distance, as can be
seen with lateral patellofemoral maltracking. Consistent with this,
there is mild-to-moderate edema within the superolateral aspect of
Hoffa's fat pad suggesting infrapatellar fat pad impingement.

## 2023-02-18 ENCOUNTER — Ambulatory Visit
Admission: EM | Admit: 2023-02-18 | Discharge: 2023-02-18 | Disposition: A | Discharge status: Home / Self Care | Payer: 59 | Attending: Internal Medicine | Admitting: Internal Medicine

## 2023-02-18 DIAGNOSIS — J309 Allergic rhinitis, unspecified: Secondary | ICD-10-CM

## 2023-02-18 DIAGNOSIS — J329 Chronic sinusitis, unspecified: Secondary | ICD-10-CM | POA: Diagnosis not present

## 2023-02-18 MED ORDER — PROMETHAZINE-DM 6.25-15 MG/5ML PO SYRP: 5.0000 mL | ORAL_SOLUTION | Freq: Three times a day (TID) | ORAL | 0 refills | Status: DC | PRN

## 2023-02-18 MED ORDER — PREDNISONE 20 MG PO TABS: ORAL_TABLET | ORAL | 0 refills | Status: DC

## 2023-02-18 MED ORDER — AMOXICILLIN-POT CLAVULANATE 875-125 MG PO TABS: 1.0000 | ORAL_TABLET | Freq: Two times a day (BID) | ORAL | 0 refills | Status: DC

## 2023-02-18 NOTE — ED Provider Notes (Signed)
[facility]  Note:  This document was prepared using Conservation officer, historic buildings and may include unintentional dictation errors.  MRN: [id number] DOB: 15-Aug-2000  Subjective:   Carla Wang is a 22 y.o. female presenting for 10-day history of acute onset persistent productive cough, scratchy throat, sinus drainage, right ear pain.  Has a history of sinus infections.  Has a history of chronic rhinitis.  Takes her allergy medications consistently.  Also has eustachian tube dysfunction.  No history of asthma.  No smoking of any kind including cigarettes, cigars, vaping, marijuana use.    No current facility-administered medications for this encounter.  Current Outpatient Medications:    benzonatate (TESSALON) 200 MG capsule, Take 1 capsule (200 mg total) by mouth 3 (three) times daily as needed., Disp: 20 capsule, Rfl: 0   cetirizine (ZYRTEC) 10 MG tablet, Take 10 mg by mouth daily., Disp: , Rfl:    fluticasone (FLONASE) 50 MCG/ACT nasal spray, Place 2 sprays into both nostrils daily., Disp: 16 g, Rfl: 11   ibuprofen (ADVIL) 800 MG tablet, Take 1 tablet (800 mg total) by mouth every 8 (eight) hours as needed (pain)., Disp: 30 tablet, Rfl: 0   LO LOESTRIN FE 1 MG-10 MCG / 10 MCG tablet, Take 1 tablet by mouth daily., Disp: , Rfl:    montelukast (SINGULAIR) 10 MG tablet, Take 1 tablet (10 mg total) by mouth at bedtime., Disp: 90 tablet, Rfl: 3   No Known Allergies  Past Medical History:  Diagnosis Date   Allergy    ETD (Eustachian tube dysfunction), bilateral 11/29/2020   eval by ENT 2021     Past Surgical History:  Procedure Laterality Date   HAND SURGERY     INGUINAL HERNIA REPAIR     KNEE ARTHROSCOPY WITH MEDIAL MENISECTOMY Right 08/08/2021   Procedure: RIGHT KNEE ARTHROSCOPY WITH PARTIAL MEDIAL MENISECTOMY;  Surgeon: Tarry Kos, MD;  Location: [facility];  Service: Orthopedics;  Laterality: Right;    Family History  Problem  Relation Age of Onset   Diabetes Mother    Healthy Brother    Diabetes Maternal Aunt    Diabetes Maternal Grandmother    Prostate cancer Paternal Grandfather    Heart disease Neg Hx    Hyperlipidemia Neg Hx    Hypertension Neg Hx     Social History   Tobacco Use   Smoking status: Never   Smokeless tobacco: Never  Vaping Use   Vaping status: Never Used  Substance Use Topics   Alcohol use: Yes    Comment: occas   Drug use: No    ROS   Objective:   Vitals: BP 123/85 (BP Location: Right Arm)   Pulse 81   Temp 98.6 F (37 C) (Oral)   Resp 16   SpO2 99%   Physical Exam Constitutional:      General: She is not in acute distress.    Appearance: Normal appearance. She is well-developed and normal weight. She is not ill-appearing, toxic-appearing or diaphoretic.  HENT:     Head: Normocephalic and atraumatic.     Right Ear: Ear canal and external ear normal. No drainage or tenderness. A middle ear effusion is present. There is no impacted cerumen. Tympanic membrane is not erythematous or bulging.     Left Ear: Ear canal and external ear normal. No drainage or tenderness. A middle ear effusion is present. There is no impacted cerumen. Tympanic membrane is not erythematous or bulging.  Nose: Congestion present. No rhinorrhea.     Mouth/Throat:     Mouth: Mucous membranes are moist. No oral lesions.     Pharynx: No pharyngeal swelling, oropharyngeal exudate, posterior oropharyngeal erythema or uvula swelling.     Tonsils: No tonsillar exudate or tonsillar abscesses.     Comments: Thick postnasal drainage overlying pharynx. Eyes:     General: No scleral icterus.       Right eye: No discharge.        Left eye: No discharge.     Extraocular Movements: Extraocular movements intact.     Right eye: Normal extraocular motion.     Left eye: Normal extraocular motion.     Conjunctiva/sclera: Conjunctivae normal.  Cardiovascular:     Rate and Rhythm: Normal rate and regular  rhythm.     Heart sounds: Normal heart sounds. No murmur heard.    No friction rub. No gallop.  Pulmonary:     Effort: Pulmonary effort is normal. No respiratory distress.     Breath sounds: No stridor. No wheezing, rhonchi or rales.  Chest:     Chest wall: No tenderness.  Musculoskeletal:     Cervical back: Normal range of motion and neck supple.  Lymphadenopathy:     Cervical: No cervical adenopathy.  Skin:    General: Skin is warm and dry.  Neurological:     General: No focal deficit present.     Mental Status: She is alert and oriented to person, place, and time.  Psychiatric:        Mood and Affect: Mood normal.        Behavior: Behavior normal.     Assessment and Plan :   PDMP not reviewed this encounter.  1. Recurrent sinusitis   2. Chronic allergic rhinitis    Will manage with Augmentin for recurrent sinusitis.  Prednisone for acute on chronic allergic rhinitis.  Deferred imaging given clear cardiopulmonary exam, hemodynamically stable vital signs.  Counseled patient on potential for adverse effects with medications prescribed/recommended today, ER and return-to-clinic precautions discussed, patient verbalized understanding.    Wallis Bamberg, PA-C 02/18/23 [id number]

## 2023-02-18 NOTE — ED Triage Notes (Signed)
Pt c/o right earache, scratchy throat, prod cough-sx started 12/8-mild relief with OTC meds-NAD-steady gait

## 2023-02-18 NOTE — Discharge Instructions (Signed)
We will manage this as a sinus infection with amoxicillin-clavulanate. Use the steroid prednisone for the chronic rhinitis. For sore throat or cough try using a honey-based tea. Use 3 teaspoons of honey with juice squeezed from half lemon. Place shaved pieces of ginger into 1/2-1 cup of water and warm over stove top. Then mix the ingredients and repeat every 4 hours as needed. Please take Tylenol 500mg -650mg  every 6 hours for throat pain, fevers, aches and pains. Hydrate very well with at least 2 liters of water. Eat light meals such as soups (chicken and noodles, vegetable, chicken and wild rice).  Do not eat foods that you are allergic to.  Taking an antihistamine like Zyrtec can help against postnasal drainage, sinus congestion which can cause sinus pain, sinus headaches, throat pain, painful swallowing, coughing.  You can take this cough syrup as needed.

## 2023-03-14 ENCOUNTER — Ambulatory Visit (INDEPENDENT_AMBULATORY_CARE_PROVIDER_SITE_OTHER): Payer: 59 | Admitting: Nurse Practitioner

## 2023-03-14 ENCOUNTER — Encounter: Payer: Self-pay | Admitting: Nurse Practitioner

## 2023-03-14 VITALS — BP 128/79 | HR 85 | Temp 98.3°F | Resp 18 | Ht 64.0 in | Wt 169.8 lb

## 2023-03-14 DIAGNOSIS — J309 Allergic rhinitis, unspecified: Secondary | ICD-10-CM

## 2023-03-14 DIAGNOSIS — Z23 Encounter for immunization: Secondary | ICD-10-CM | POA: Diagnosis not present

## 2023-03-14 DIAGNOSIS — Z0000 Encounter for general adult medical examination without abnormal findings: Secondary | ICD-10-CM | POA: Diagnosis not present

## 2023-03-14 LAB — COMPREHENSIVE METABOLIC PANEL
ALT: 17 U/L (ref 0–35)
AST: 21 U/L (ref 0–37)
Albumin: 4.4 g/dL (ref 3.5–5.2)
Alkaline Phosphatase: 39 U/L (ref 39–117)
BUN: 10 mg/dL (ref 6–23)
CO2: 27 meq/L (ref 19–32)
Calcium: 9.2 mg/dL (ref 8.4–10.5)
Chloride: 104 meq/L (ref 96–112)
Creatinine, Ser: 0.64 mg/dL (ref 0.40–1.20)
GFR: 123.38 mL/min (ref >60.00)
Glucose, Bld: 82 mg/dL (ref 70–99)
Potassium: 3.6 meq/L (ref 3.5–5.1)
Sodium: 139 meq/L (ref 135–145)
Total Bilirubin: 0.4 mg/dL (ref 0.2–1.2)
Total Protein: 7.5 g/dL (ref 6.0–8.3)

## 2023-03-14 MED ORDER — FLUTICASONE PROPIONATE 50 MCG/ACT NA SUSP
2.0000 | Freq: Every day | NASAL | 11 refills | Status: DC
Start: 2023-03-14 — End: 2023-04-30

## 2023-03-14 MED ORDER — MONTELUKAST SODIUM 10 MG PO TABS
10.0000 mg | ORAL_TABLET | Freq: Every day | ORAL | 3 refills | Status: DC
Start: 2023-03-14 — End: 2023-04-30

## 2023-03-14 NOTE — Progress Notes (Signed)
 Complete physical exam  Patient: Carla Wang   DOB: [birth year]   22 y.o. Female  MRN: [id number] Visit Date: 03/14/2023  Subjective:    Chief Complaint  Patient presents with   Annual Exam    Flu vaccine is due    PSALM ARMAN is a 23 y.o. female who presents today for a complete physical exam. She reports consuming a general and low fat diet.  Cardio and weight training 3x/week  She generally feels well. She reports sleeping well. She does not have additional problems to discuss today.  Vision:Yes Dental:Yes STD Screen:No  BP Readings from Last 3 Encounters:  03/14/23 128/79  02/18/23 123/85  12/30/22 125/82   Wt Readings from Last 3 Encounters:  03/14/23 169 lb 12.8 oz (77 kg)  12/30/22 169 lb (76.7 kg)  10/14/22 173 lb (78.5 kg)   Most recent fall risk assessment:    03/14/2023    9:16 AM  Fall Risk   Falls in the past year? 0  Number falls in past yr: 0  Injury with Fall? 0  Risk for fall due to : No Fall Risks  Follow up Falls evaluation completed   Depression screen:Yes - No Depression Most recent depression screenings:    03/14/2023    9:16 AM 12/30/2022    1:09 PM  PHQ 2/9 Scores  PHQ - 2 Score 1 0  PHQ- 9 Score 4    HPI  No problem-specific Assessment & Plan notes found for this encounter.   Past Medical History:  Diagnosis Date   Allergy    ETD (Eustachian tube dysfunction), bilateral 11/29/2020   eval by ENT 2021   Past Surgical History:  Procedure Laterality Date   HAND SURGERY     INGUINAL HERNIA REPAIR     KNEE ARTHROSCOPY WITH MEDIAL MENISECTOMY Right 08/08/2021   Procedure: RIGHT KNEE ARTHROSCOPY WITH PARTIAL MEDIAL MENISECTOMY;  Surgeon: Jerri Kay HERO, MD;  Location: [facility];  Service: Orthopedics;  Laterality: Right;   Social History   Socioeconomic History   Marital status: Single    Spouse name: Not on file   Number of children: Not on file   Years of education: Not on file   Highest education level:  Bachelor's degree (e.g., BA, AB, BS)  Occupational History   Occupation: Consulting Civil Engineer   Occupation: gradutates in 2022    Employer: [employer]  Tobacco Use   Smoking status: Never   Smokeless tobacco: Never  Vaping Use   Vaping status: Never Used  Substance and Sexual Activity   Alcohol use: Yes    Comment: occas   Drug use: No   Sexual activity: Yes    Birth control/protection: Pill  Other Topics Concern   Not on file  Social History Narrative   S   Social Drivers of Health   Financial Resource Strain: Low Risk  (12/30/2022)   Overall Financial Resource Strain (CARDIA)    Difficulty of Paying Living Expenses: Not hard at all  Food Insecurity: No Food Insecurity (12/30/2022)   Hunger Vital Sign    Worried About Running Out of Food in the Last Year: Never true    Ran Out of Food in the Last Year: Never true  Transportation Needs: No Transportation Needs (12/30/2022)   PRAPARE - Administrator, Civil Service (Medical): No    Lack of Transportation (Non-Medical): No  Physical Activity: Insufficiently Active (12/30/2022)   Exercise Vital Sign    Days of Exercise  per Week: 3 days    Minutes of Exercise per Session: 30 min  Stress: No Stress Concern Present (12/30/2022)   [employer] of Occupational Health - Occupational Stress Questionnaire    Feeling of Stress : Only a little  Social Connections: Moderately Integrated (12/30/2022)   Social Connection and Isolation Panel [NHANES]    Frequency of Communication with Friends and Family: More than three times a week    Frequency of Social Gatherings with Friends and Family: Three times a week    Attends Religious Services: More than 4 times per year    Active Member of Clubs or Organizations: Yes    Attends Banker Meetings: 1 to 4 times per year    Marital Status: Never married  Recent Concern: Social Connections - Moderately Isolated (10/08/2022)   Social Connection and Isolation Panel [NHANES]     Frequency of Communication with Friends and Family: More than three times a week    Frequency of Social Gatherings with Friends and Family: Three times a week    Attends Religious Services: More than 4 times per year    Active Member of Clubs or Organizations: No    Attends Banker Meetings: Not on file    Marital Status: Never married  Intimate Partner Violence: Unknown (06/07/2021)   Received from [employer], [facility]   HITS    Physically Hurt: Not on file    Insult or Talk Down To: Not on file    Threaten Physical Harm: Not on file    Scream or Curse: Not on file   Family Status  Relation Name Status   Mother  Alive   Father  Alive   Brother  Alive   Mat Aunt  Alive   MGM  Alive   MGF  Alive   PGM  Alive   PGF  Alive   Neg Hx  (Not Specified)  No partnership data on file   Family History  Problem Relation Age of Onset   Diabetes Mother    Healthy Brother    Diabetes Maternal Aunt    Diabetes Maternal Grandmother    Prostate cancer Paternal Grandfather    Heart disease Neg Hx    Hyperlipidemia Neg Hx    Hypertension Neg Hx    No Known Allergies  Patient Care Team: Solenne Manwarren, Roselie Rockford, NP as PCP - General (Internal Medicine) Netta, Isadora KATHEE RIGGERS (Dermatology) Hughie Sharper, MD as Consulting Physician (Sports Medicine) Darcel Pool, MD as Consulting Physician (Obstetrics and Gynecology) Ethyl Lonni BRAVO, MD (Inactive) as Consulting Physician (Otolaryngology)   Medications: Outpatient Medications Prior to Visit  Medication Sig   cetirizine (ZYRTEC) 10 MG tablet Take 10 mg by mouth daily.   ibuprofen  (ADVIL ) 800 MG tablet Take 1 tablet (800 mg total) by mouth every 8 (eight) hours as needed (pain).   LO LOESTRIN FE 1 MG-10 MCG / 10 MCG tablet Take 1 tablet by mouth daily.   [DISCONTINUED] montelukast  (SINGULAIR ) 10 MG tablet Take 1 tablet (10 mg total) by mouth at bedtime.   [DISCONTINUED] amoxicillin -clavulanate (AUGMENTIN ) 875-125 MG  tablet Take 1 tablet by mouth 2 (two) times daily. (Patient not taking: Reported on 03/14/2023)   [DISCONTINUED] benzonatate  (TESSALON ) 200 MG capsule Take 1 capsule (200 mg total) by mouth 3 (three) times daily as needed. (Patient not taking: Reported on 03/14/2023)   [DISCONTINUED] doxycycline  (VIBRA -TABS) 100 MG tablet Take 1 tablet by mouth 2 (two) times daily. (Patient not taking: Reported on 03/14/2023)   [  DISCONTINUED] fluticasone  (FLONASE ) 50 MCG/ACT nasal spray Place 2 sprays into both nostrils daily. (Patient not taking: Reported on 03/14/2023)   [DISCONTINUED] predniSONE  (DELTASONE ) 20 MG tablet Take 2 tablets daily with breakfast. (Patient not taking: Reported on 03/14/2023)   [DISCONTINUED] promethazine -dextromethorphan (PROMETHAZINE -DM) 6.25-15 MG/5ML syrup Take 5 mLs by mouth 3 (three) times daily as needed for cough. (Patient not taking: Reported on 03/14/2023)   No facility-administered medications prior to visit.    Review of Systems  Constitutional:  Negative for activity change, appetite change and unexpected weight change.  Respiratory: Negative.    Cardiovascular: Negative.   Gastrointestinal: Negative.   Endocrine: Negative for cold intolerance and heat intolerance.  Genitourinary: Negative.   Musculoskeletal: Negative.   Skin: Negative.   Neurological: Negative.   Hematological: Negative.   Psychiatric/Behavioral:  Negative for behavioral problems, decreased concentration, dysphoric mood, hallucinations, self-injury, sleep disturbance and suicidal ideas. The patient is not nervous/anxious.         Objective:  BP 128/79 (BP Location: Left Arm, Patient Position: Sitting, Cuff Size: Normal)   Pulse 85   Temp 98.3 F (36.8 C) (Temporal)   Resp 18   Ht 5' 4 (1.626 m)   Wt 169 lb 12.8 oz (77 kg)   SpO2 100%   BMI 29.15 kg/m     Physical Exam Vitals and nursing note reviewed.  Constitutional:      General: She is not in acute distress. HENT:     Right Ear:  Tympanic membrane, ear canal and external ear normal.     Left Ear: Tympanic membrane, ear canal and external ear normal.     Nose: Nose normal.  Eyes:     Extraocular Movements: Extraocular movements intact.     Conjunctiva/sclera: Conjunctivae normal.     Pupils: Pupils are equal, round, and reactive to light.  Neck:     Thyroid: No thyroid mass, thyromegaly or thyroid tenderness.  Cardiovascular:     Rate and Rhythm: Normal rate and regular rhythm.     Pulses: Normal pulses.     Heart sounds: Normal heart sounds.  Pulmonary:     Effort: Pulmonary effort is normal.     Breath sounds: Normal breath sounds.  Abdominal:     General: Bowel sounds are normal.     Palpations: Abdomen is soft.  Musculoskeletal:        General: Normal range of motion.     Cervical back: Normal range of motion and neck supple.     Right lower leg: No edema.     Left lower leg: No edema.  Lymphadenopathy:     Cervical: No cervical adenopathy.  Skin:    General: Skin is warm and dry.  Neurological:     Mental Status: She is alert and oriented to person, place, and time.     Cranial Nerves: No cranial nerve deficit.  Psychiatric:        Mood and Affect: Mood normal.        Behavior: Behavior normal.        Thought Content: Thought content normal.      No results found for any visits on 03/14/23.    Assessment & Plan:    Routine Health Maintenance and Physical Exam  Immunization History  Administered Date(s) Administered   HPV 9-valent 09/27/2011, 11/29/2011, 04/01/2012   Influenza, Seasonal, Injecte, Preservative Fre 03/14/2023   Influenza,inj,Quad PF,6+ Mos 12/01/2018, 11/29/2019, 11/29/2020, 12/05/2021   Tdap 11/29/2019   Health Maintenance  Topic Date Due  COVID-19 Vaccine (1 - 2024-25 season) 05/27/2023 (Originally 11/03/2022)   Cervical Cancer Screening (Pap smear)  10/21/2025   DTaP/Tdap/Td (2 - Td or Tdap) 11/28/2029   INFLUENZA VACCINE  Completed   HPV VACCINES  Completed    Hepatitis C Screening  Completed   HIV Screening  Completed   Discussed health benefits of physical activity, and encouraged her to engage in regular exercise appropriate for her age and condition.  Problem List Items Addressed This Visit     Chronic allergic rhinitis   Relevant Medications   fluticasone  (FLONASE ) 50 MCG/ACT nasal spray   montelukast  (SINGULAIR ) 10 MG tablet   Other Visit Diagnoses       Preventative health care    -  Primary   Relevant Orders   Flu vaccine trivalent PF, 6mos and older(Flulaval,Afluria,Fluarix,Fluzone) (Completed)   Comprehensive metabolic panel     Immunization due       Relevant Orders   Flu vaccine trivalent PF, 6mos and older(Flulaval,Afluria,Fluarix,Fluzone) (Completed)      Return in about 1 year (around 03/13/2024) for CPE (fasting).     Roselie Mood, NP

## 2023-03-14 NOTE — Patient Instructions (Signed)
 Go to lab Maintain Heart healthy diet and daily exercise. Maintain current medications.  Preventive Care 6-25 Years Old, Female Preventive care refers to lifestyle choices and visits with your health care provider that can promote health and wellness. Preventive care visits are also called wellness exams. What can I expect for my preventive care visit? Counseling During your preventive care visit, your health care provider may ask about your: Medical history, including: Past medical problems. Family medical history. Pregnancy history. Current health, including: Menstrual cycle. Method of birth control. Emotional well-being. Home life and relationship well-being. Sexual activity and sexual health. Lifestyle, including: Alcohol, nicotine or tobacco, and drug use. Access to firearms. Diet, exercise, and sleep habits. Work and work Astronomer. Sunscreen use. Safety issues such as seatbelt and bike helmet use. Physical exam Your health care provider may check your: Height and weight. These may be used to calculate your BMI (body mass index). BMI is a measurement that tells if you are at a healthy weight. Waist circumference. This measures the distance around your waistline. This measurement also tells if you are at a healthy weight and may help predict your risk of certain diseases, such as type 2 diabetes and high blood pressure. Heart rate and blood pressure. Body temperature. Skin for abnormal spots. What immunizations do I need?  Vaccines are usually given at various ages, according to a schedule. Your health care provider will recommend vaccines for you based on your age, medical history, and lifestyle or other factors, such as travel or where you work. What tests do I need? Screening Your health care provider may recommend screening tests for certain conditions. This may include: Pelvic exam and Pap test. Lipid and cholesterol levels. Diabetes screening. This is done by  checking your blood sugar (glucose) after you have not eaten for a while (fasting). Hepatitis B test. Hepatitis C test. HIV (human immunodeficiency virus) test. STI (sexually transmitted infection) testing, if you are at risk. BRCA-related cancer screening. This may be done if you have a family history of breast, ovarian, tubal, or peritoneal cancers. Talk with your health care provider about your test results, treatment options, and if necessary, the need for more tests. Follow these instructions at home: Eating and drinking  Eat a healthy diet that includes fresh fruits and vegetables, whole grains, lean protein, and low-fat dairy products. Take vitamin and mineral supplements as recommended by your health care provider. Do not drink alcohol if: Your health care provider tells you not to drink. You are pregnant, may be pregnant, or are planning to become pregnant. If you drink alcohol: Limit how much you have to 0-1 drink a day. Know how much alcohol is in your drink. In the [place] one drink equals one 12 oz bottle of beer (355 mL), one 5 oz glass of wine (148 mL), or one 1 oz glass of hard liquor (44 mL). Lifestyle Brush your teeth every morning and night with fluoride toothpaste. Floss one time each day. Exercise for at least 30 minutes 5 or more days each week. Do not use any products that contain nicotine or tobacco. These products include cigarettes, chewing tobacco, and vaping devices, such as e-cigarettes. If you need help quitting, ask your health care provider. Do not use drugs. If you are sexually active, practice safe sex. Use a condom or other form of protection to prevent STIs. If you do not wish to become pregnant, use a form of birth control. If you plan to become pregnant, see your health care  provider for a prepregnancy visit. Find healthy ways to manage stress, such as: Meditation, yoga, or listening to music. Journaling. Talking to a trusted person. Spending time  with friends and family. Minimize exposure to UV radiation to reduce your risk of skin cancer. Safety Always wear your seat belt while driving or riding in a vehicle. Do not drive: If you have been drinking alcohol. Do not ride with someone who has been drinking. If you have been using any mind-altering substances or drugs. While texting. When you are tired or distracted. Wear a helmet and other protective equipment during sports activities. If you have firearms in your house, make sure you follow all gun safety procedures. Seek help if you have been physically or sexually abused. What's next? Go to your health care provider once a year for an annual wellness visit. Ask your health care provider how often you should have your eyes and teeth checked. Stay up to date on all vaccines. This information is not intended to replace advice given to you by your health care provider. Make sure you discuss any questions you have with your health care provider. Document Revised: 08/16/2020 Document Reviewed: 08/16/2020 Elsevier Patient Education  2024 [employer].

## 2023-04-21 ENCOUNTER — Ambulatory Visit
Admission: EM | Admit: 2023-04-21 | Discharge: 2023-04-21 | Disposition: A | Discharge status: Home / Self Care | Payer: 59 | Attending: Family Medicine | Admitting: Family Medicine

## 2023-04-21 DIAGNOSIS — J069 Acute upper respiratory infection, unspecified: Secondary | ICD-10-CM

## 2023-04-21 DIAGNOSIS — R051 Acute cough: Secondary | ICD-10-CM | POA: Diagnosis not present

## 2023-04-21 DIAGNOSIS — T148XXA Other injury of unspecified body region, initial encounter: Secondary | ICD-10-CM

## 2023-04-21 LAB — POCT INFLUENZA A/B
Influenza A, POC: NEGATIVE
Influenza B, POC: NEGATIVE

## 2023-04-21 MED ORDER — KETOROLAC TROMETHAMINE 30 MG/ML IJ SOLN
30.0000 mg | Freq: Once | INTRAMUSCULAR | Status: AC
  Administered 2023-04-21: 30 mg via INTRAMUSCULAR

## 2023-04-21 NOTE — Discharge Instructions (Addendum)
 You were given a Toradol injection in clinic today. Do not take any over the counter NSAID's such as Advil, ibuprofen, Aleve, or naproxen for 24 hours. You may take tylenol if needed.  Please treat your symptoms with over the counter cough medication, tylenol or ibuprofen, humidifier, and rest. Viral illnesses can last 7-14 days. Please follow up with your PCP if your symptoms are not improving. Please go to the ER for any worsening symptoms. This includes but is not limited to fever you can not control with tylenol or ibuprofen, you are not able to stay hydrated, you have shortness of breath or chest pain.  Thank you for choosing [facility] for your healthcare needs. I hope you feel better soon!

## 2023-04-21 NOTE — ED Triage Notes (Signed)
 Pt states that she has some right upper abdominal pain x 5 days  Pt states that she also has some right ear pain that radiates to her right jaw and a scratchy throat. X3-4 days

## 2023-04-21 NOTE — ED Provider Notes (Signed)
 [facility]    CSN: [id number] Arrival date & time: 04/21/23  1253      History   Chief Complaint Chief Complaint  Patient presents with   Abdominal Pain    Upper right side abdominal pain x5 days   Ear Pain    Right ear pain and scratchy throat x3-4 days    HPI Carla Wang is a 23 y.o. female  presents for evaluation of URI symptoms for 3 days. Patient reports associated symptoms of cough, congestion, sore throat, right ear pain. Denies N/V/D, fevers, body aches, shortness of breath. Patient does not have a hx of asthma. Patient is not an active smoker.   Reports her mother had flu last week.  Patient also reports muscle pain to her right lateral lower ribs.  Denies any known injury or inciting event but does states it is worsened since her cough began.  States pain only occurs if she moves or coughs.  No hemoptysis or shortness of breath.  Pt has taken cough medicine OTC for symptoms. Pt has no other concerns at this time.    Abdominal Pain Associated symptoms: cough and sore throat     Past Medical History:  Diagnosis Date   Allergy    ETD (Eustachian tube dysfunction), bilateral 11/29/2020   eval by ENT 2021    Patient Active Problem List   Diagnosis Date Noted   Acute non-recurrent pansinusitis 12/14/2021   Constipation 12/05/2021   Patellofemoral dysfunction of right knee 08/08/2021   Synovial plica of right knee 08/08/2021   Acute medial meniscus tear of right knee 05/30/2021   Chronic allergic rhinitis 11/29/2019   Acne vulgaris 10/21/2017   Seborrheic dermatitis 10/21/2017   Eczema 10/21/2017   Dysmenorrhea 10/20/2015   Scoliosis 09/08/2014    Past Surgical History:  Procedure Laterality Date   HAND SURGERY     INGUINAL HERNIA REPAIR     KNEE ARTHROSCOPY WITH MEDIAL MENISECTOMY Right 08/08/2021   Procedure: RIGHT KNEE ARTHROSCOPY WITH PARTIAL MEDIAL MENISECTOMY;  Surgeon: Tarry Kos, MD;  Location: [facility];  Service:  Orthopedics;  Laterality: Right;    OB History   No obstetric history on file.      Home Medications    Prior to Admission medications   Medication Sig Start Date End Date Taking? Authorizing Provider  cetirizine (ZYRTEC) 10 MG tablet Take 10 mg by mouth daily.   Yes [provider]  fluticasone (FLONASE) 50 MCG/ACT nasal spray Place 2 sprays into both nostrils daily. 03/14/23  Yes Nche, Bonna Gains, NP  ibuprofen (ADVIL) 800 MG tablet Take 1 tablet (800 mg total) by mouth every 8 (eight) hours as needed (pain). 10/14/22  Yes Salvatore Decent, FNP  LO LOESTRIN FE 1 MG-10 MCG / 10 MCG tablet Take 1 tablet by mouth daily. 12/08/21  Yes [provider]  montelukast (SINGULAIR) 10 MG tablet Take 1 tablet (10 mg total) by mouth at bedtime. 03/14/23  Yes Nche, Bonna Gains, NP    Family History Family History  Problem Relation Age of Onset   Diabetes Mother    Healthy Brother    Diabetes Maternal Aunt    Diabetes Maternal Grandmother    Prostate cancer Paternal Grandfather    Heart disease Neg Hx    Hyperlipidemia Neg Hx    Hypertension Neg Hx     Social History Social History   Tobacco Use   Smoking status: Never   Smokeless tobacco: Never  Vaping Use  Vaping status: Never Used  Substance Use Topics   Alcohol use: Yes    Comment: occas   Drug use: No     Allergies   Patient has no known allergies.   Review of Systems Review of Systems  HENT:  Positive for congestion and sore throat.   Respiratory:  Positive for cough.   Musculoskeletal:  Positive for myalgias.     Physical Exam Triage Vital Signs ED Triage Vitals  Encounter Vitals Group     BP 04/21/23 1312 125/80     Systolic BP Percentile --      Diastolic BP Percentile --      Pulse Rate 04/21/23 1312 83     Resp 04/21/23 1312 19     Temp 04/21/23 1312 98.2 F (36.8 C)     Temp Source 04/21/23 1312 Oral     SpO2 04/21/23 1312 98 %     Weight 04/21/23 1311 170 lb (77.1 kg)      Height 04/21/23 1311 5\' 4"  (1.626 m)     Head Circumference --      Peak Flow --      Pain Score 04/21/23 1311 4     Pain Loc --      Pain Education --      Exclude from Growth Chart --    No data found.  Updated Vital Signs BP 125/80   Pulse 83   Temp 98.2 F (36.8 C) (Oral)   Resp 19   Ht 5\' 4"  (1.626 m)   Wt 170 lb (77.1 kg)   LMP 03/21/2023   SpO2 98%   BMI 29.18 kg/m   Visual Acuity Right Eye Distance:   Left Eye Distance:   Bilateral Distance:    Right Eye Near:   Left Eye Near:    Bilateral Near:     Physical Exam Vitals and nursing note reviewed.  Constitutional:      General: She is not in acute distress.    Appearance: Normal appearance. She is well-developed. She is not ill-appearing, toxic-appearing or diaphoretic.  HENT:     Head: Normocephalic and atraumatic.     Right Ear: Tympanic membrane and ear canal normal.     Left Ear: Tympanic membrane and ear canal normal.     Nose: Congestion present.     Mouth/Throat:     Mouth: Mucous membranes are moist.     Pharynx: Oropharynx is clear. Uvula midline. Posterior oropharyngeal erythema present.     Tonsils: No tonsillar exudate or tonsillar abscesses.  Eyes:     Conjunctiva/sclera: Conjunctivae normal.     Pupils: Pupils are equal, round, and reactive to light.  Cardiovascular:     Rate and Rhythm: Normal rate and regular rhythm.     Heart sounds: Normal heart sounds.  Pulmonary:     Effort: Pulmonary effort is normal.     Breath sounds: Normal breath sounds.  Chest:       Comments: There is no swelling, ecchymosis, erythema of the right lateral lower chest wall.  No deformity, step-off.  Equal chest expansion bilaterally. Musculoskeletal:     Cervical back: Normal range of motion and neck supple.  Lymphadenopathy:     Cervical: No cervical adenopathy.  Skin:    General: Skin is warm and dry.  Neurological:     General: No focal deficit present.     Mental Status: She is alert and oriented  to person, place, and time.  Psychiatric:  Mood and Affect: Mood normal.        Behavior: Behavior normal.      UC Treatments / Results  Labs (all labs ordered are listed, but only abnormal results are displayed) Labs Reviewed  POCT INFLUENZA A/B    EKG   Radiology No results found.  Procedures Procedures (including critical care time)  Medications Ordered in UC Medications  ketorolac (TORADOL) 30 MG/ML injection 30 mg (30 mg Intramuscular Given 04/21/23 1342)    Initial Impression / Assessment and Plan / UC Course  I have reviewed the triage vital signs and the nursing notes.  Pertinent labs & imaging results that were available during my care of the patient were reviewed by me and considered in my medical decision making (see chart for details).     Reviewed exam and symptoms with patient.  No red flags.  Discussed chest wall pain likely MSK and nature.  Toradol injection given in clinic.  She was monitored for 10 minutes after injection with no reaction noted and tolerated well.  She was instructed no NSAIDs for 24 hours and she verbalized understanding.  Negative rapid flu.  Discussed viral illness and symptomatic treatment.  Patient declined cough medicine will use OTC treatments as needed.  Encourage fluids and rest.  PCP follow-up if symptoms do not improve.  ER precautions reviewed and patient verbalized understanding. Final Clinical Impressions(s) / UC Diagnoses   Final diagnoses:  Muscle strain  Acute cough  Viral upper respiratory illness     Discharge Instructions      You were given a Toradol injection in clinic today. Do not take any over the counter NSAID's such as Advil, ibuprofen, Aleve, or naproxen for 24 hours. You may take tylenol if needed.  Please treat your symptoms with over the counter cough medication, tylenol or ibuprofen, humidifier, and rest. Viral illnesses can last 7-14 days. Please follow up with your PCP if your symptoms are not  improving. Please go to the ER for any worsening symptoms. This includes but is not limited to fever you can not control with tylenol or ibuprofen, you are not able to stay hydrated, you have shortness of breath or chest pain.  Thank you for choosing [facility] for your healthcare needs. I hope you feel better soon!       ED Prescriptions   None    PDMP not reviewed this encounter.   Radford Pax, NP 04/21/23 [phone]

## 2023-04-29 ENCOUNTER — Encounter: Payer: Self-pay | Admitting: Nurse Practitioner

## 2023-04-30 ENCOUNTER — Ambulatory Visit
Admission: EM | Admit: 2023-04-30 | Discharge: 2023-04-30 | Disposition: A | Discharge status: Home / Self Care | Payer: 59 | Attending: Nurse Practitioner | Admitting: Nurse Practitioner

## 2023-04-30 DIAGNOSIS — J019 Acute sinusitis, unspecified: Secondary | ICD-10-CM | POA: Diagnosis not present

## 2023-04-30 MED ORDER — AMOXICILLIN 875 MG PO TABS: 875.0000 mg | ORAL_TABLET | Freq: Two times a day (BID) | ORAL | 0 refills | Status: AC

## 2023-04-30 MED ORDER — PSEUDOEPH-BROMPHEN-DM 30-2-10 MG/5ML PO SYRP: 10.0000 mL | ORAL_SOLUTION | Freq: Four times a day (QID) | ORAL | 0 refills | Status: AC | PRN

## 2023-04-30 MED ORDER — METHYLPREDNISOLONE 4 MG PO TBPK: ORAL_TABLET | ORAL | 0 refills | Status: DC

## 2023-04-30 NOTE — ED Triage Notes (Signed)
 Patient states sinus pressure and nasal congestion x 1 week. Patient has been taken OTC medication for relief.

## 2023-04-30 NOTE — ED Provider Notes (Signed)
 [facility]    CSN: [id number] Arrival date & time: 04/30/23  1321      History   Chief Complaint No chief complaint on file.   HPI Carla Wang is a 23 y.o. female.   Subjective:   Carla Wang is a 23 year old female presenting for evaluation of a possible sinus infection. Her symptoms began on 04/17/23 and include sinus drainage, sinus pressure, rhinorrhea, nasal congestion, sore throat, and a productive cough. She denies fever, chills, body aches, sneezing, shortness of breath, wheezing, headache, or dizziness. She was previously seen on 04/21/23, where her symptoms were attributed to a viral respiratory illness, and she was advised to continue supportive care. Her symptoms have remained unchanged since that visit. She is managing her symptoms with Flonase, allergy medication, Sudafed, DayQuil, and NyQuil, and is staying well-hydrated. Her past medical history is unremarkable for pneumonia or bronchitis, and she is a non-smoker.   The following portions of the patient's history were reviewed and updated as appropriate: allergies, current medications, past family history, past medical history, past social history, past surgical history, and problem list.          Past Medical History:  Diagnosis Date   Allergy    ETD (Eustachian tube dysfunction), bilateral 11/29/2020   eval by ENT 2021    Patient Active Problem List   Diagnosis Date Noted   Acute non-recurrent pansinusitis 12/14/2021   Constipation 12/05/2021   Patellofemoral dysfunction of right knee 08/08/2021   Synovial plica of right knee 08/08/2021   Acute medial meniscus tear of right knee 05/30/2021   Chronic allergic rhinitis 11/29/2019   Acne vulgaris 10/21/2017   Seborrheic dermatitis 10/21/2017   Eczema 10/21/2017   Dysmenorrhea 10/20/2015   Scoliosis 09/08/2014    Past Surgical History:  Procedure Laterality Date   HAND SURGERY     INGUINAL HERNIA REPAIR     KNEE ARTHROSCOPY  WITH MEDIAL MENISECTOMY Right 08/08/2021   Procedure: RIGHT KNEE ARTHROSCOPY WITH PARTIAL MEDIAL MENISECTOMY;  Surgeon: Tarry Kos, MD;  Location: [facility];  Service: Orthopedics;  Laterality: Right;    OB History   No obstetric history on file.      Home Medications    Prior to Admission medications   Medication Sig Start Date End Date Taking? Authorizing Provider  amoxicillin (AMOXIL) 875 MG tablet Take 1 tablet (875 mg total) by mouth 2 (two) times daily for 7 days. 04/30/23 05/07/23 Yes Lurline Idol, FNP  brompheniramine-pseudoephedrine-DM 30-2-10 MG/5ML syrup Take 10 mLs by mouth every 6 (six) hours as needed (cough/congestion). 04/30/23  Yes Kaysha Parsell, FNP  LO LOESTRIN FE 1 MG-10 MCG / 10 MCG tablet Take 1 tablet by mouth daily. 12/08/21  Yes [provider]  methylPREDNISolone (MEDROL DOSEPAK) 4 MG TBPK tablet Take as directed until finished 04/30/23  Yes Lurline Idol, FNP    Family History Family History  Problem Relation Age of Onset   Diabetes Mother    Healthy Brother    Diabetes Maternal Aunt    Diabetes Maternal Grandmother    Prostate cancer Paternal Grandfather    Heart disease Neg Hx    Hyperlipidemia Neg Hx    Hypertension Neg Hx     Social History Social History   Tobacco Use   Smoking status: Never   Smokeless tobacco: Never  Vaping Use   Vaping status: Never Used  Substance Use Topics   Alcohol use: Yes    Comment: occas   Drug use:  No     Allergies   Patient has no known allergies.   Review of Systems Review of Systems  Constitutional:  Negative for activity change, appetite change, chills, diaphoresis and fever.  HENT:  Positive for congestion, postnasal drip, rhinorrhea, sinus pressure and sore throat. Negative for ear pain, sneezing and trouble swallowing.   Respiratory:  Positive for cough. Negative for shortness of breath and wheezing.   Gastrointestinal:  Negative for diarrhea, nausea and  vomiting.  Musculoskeletal:  Negative for myalgias.  Neurological:  Negative for dizziness and headaches.  All other systems reviewed and are negative.    Physical Exam Triage Vital Signs ED Triage Vitals [04/30/23 1426]  Encounter Vitals Group     BP (!) 129/90     Systolic BP Percentile      Diastolic BP Percentile      Pulse Rate 94     Resp 16     Temp 99.1 F (37.3 C)     Temp Source Oral     SpO2 99 %     Weight      Height      Head Circumference      Peak Flow      Pain Score      Pain Loc      Pain Education      Exclude from Growth Chart    No data found.  Updated Vital Signs BP (!) 129/90 (BP Location: Left Arm)   Pulse 94   Temp 99.1 F (37.3 C) (Oral)   Resp 16   LMP 03/21/2023   SpO2 99%   Visual Acuity Right Eye Distance:   Left Eye Distance:   Bilateral Distance:    Right Eye Near:   Left Eye Near:    Bilateral Near:     Physical Exam Vitals reviewed.  Constitutional:      General: She is not in acute distress.    Appearance: Normal appearance. She is not ill-appearing, toxic-appearing or diaphoretic.  HENT:     Head: Normocephalic.     Right Ear: Tympanic membrane, ear canal and external ear normal.     Left Ear: Tympanic membrane, ear canal and external ear normal.     Nose: Congestion present.     Mouth/Throat:     Mouth: Mucous membranes are moist.  Eyes:     Conjunctiva/sclera: Conjunctivae normal.  Cardiovascular:     Rate and Rhythm: Normal rate and regular rhythm.     Heart sounds: Normal heart sounds.  Pulmonary:     Effort: Pulmonary effort is normal.     Breath sounds: Normal breath sounds.  Abdominal:     Palpations: Abdomen is soft.  Musculoskeletal:        General: Normal range of motion.     Cervical back: Normal range of motion and neck supple.  Lymphadenopathy:     Cervical: No cervical adenopathy.  Skin:    General: Skin is warm and dry.  Neurological:     General: No focal deficit present.     Mental  Status: She is alert and oriented to person, place, and time.  Psychiatric:        Mood and Affect: Mood normal.        Behavior: Behavior normal.      UC Treatments / Results  Labs (all labs ordered are listed, but only abnormal results are displayed) Labs Reviewed - No data to display  EKG   Radiology No results found.  Procedures  Procedures (including critical care time)  Medications Ordered in UC Medications - No data to display  Initial Impression / Assessment and Plan / UC Course  I have reviewed the triage vital signs and the nursing notes.  Pertinent labs & imaging results that were available during my care of the patient were reviewed by me and considered in my medical decision making (see chart for details).    23 year old female with no significant medical history who presents with symptoms of sinus drainage, sinus pressure, rhinorrhea, nasal congestion, sore throat, and a productive cough for the past two weeks. She has been treating her symptoms conservatively without significant improvement. The patient denies fever, chills, body aches, sneezing, shortness of breath, wheezing, headache, or dizziness.  On examination, she is alert and oriented to person, place, and time (AAOx3), and there is a low-grade fever of 99.26F. All other vital signs are stable and unremarkable. She appears non-toxic and is in no acute distress. Physical exam findings were noted to be consistent with upper respiratory involvement. Given the duration of symptoms, a viral upper respiratory infection is the most likely diagnosis, but the lack of improvement over two weeks raises concern for a possible bacterial sinus infection. Will prescribe antibiotics, corticosteroids, and a combination antihistamine/decongestant. Instructed the patient to continue using Flonase and allergy medications as previously recommended. Discontinue Sudafed, NyQuil, and DayQuil due to potential overlap with current  treatment. Encouraged the patient to drink plenty of fluids and continue general supportive measures (rest, hydration, etc.).  Today's evaluation has revealed no signs of a dangerous process. Discussed diagnosis with patient and/or guardian. Patient and/or guardian aware of their diagnosis, possible red flag symptoms to watch out for and need for close follow up. Patient and/or guardian understands verbal and written discharge instructions. Patient and/or guardian comfortable with plan and disposition.  Patient and/or guardian has a clear mental status at this time, good insight into illness (after discussion and teaching) and has clear judgment to make decisions regarding their care  Documentation was completed with the aid of voice recognition software. Transcription may contain typographical errors. Final Clinical Impressions(s) / UC Diagnoses   Final diagnoses:  Acute non-recurrent sinusitis, unspecified location     Discharge Instructions      You have sinus infection. A sinus infection, also called sinusitis, is inflammation of your sinuses. Take medications as prescribed. Make sure you finish all the antibiotics even if you start to feel better. You may also take tylenol and/or ibuprofen as needed for pain and/or fever. Continue to use Flonase daily. You may also continue the daily allergy medication. Stop taking the sudafed, nyquil and dayquil. Drink enough fluid to keep your urine pale yellow. Staying hydrated will also help to thin your mucus. Use a cool mist humidifier to keep the humidity level in your home above 50%. Inhale steam for 10-15 minutes, 3-4 times a day. You can do this in the bathroom while a hot shower is running and/or purchase over-the-counter vapor shower tablets which is great to help with nasal congestion.Try to limit your exposure to cool or dry air. Sleep with your head raised to decrease post-nasal drainage. Make sure you get enough sleep each night. Change your  toothbrush after finishing the antibiotics.      ED Prescriptions     Medication Sig Dispense Auth. Provider   amoxicillin (AMOXIL) 875 MG tablet Take 1 tablet (875 mg total) by mouth 2 (two) times daily for 7 days. 14 tablet Lurline Idol, [place]  methylPREDNISolone (MEDROL DOSEPAK) 4 MG TBPK tablet Take as directed until finished 21 tablet Lurline Idol, FNP   brompheniramine-pseudoephedrine-DM 30-2-10 MG/5ML syrup Take 10 mLs by mouth every 6 (six) hours as needed (cough/congestion). 120 mL Lurline Idol, FNP      PDMP not reviewed this encounter.   Lurline Idol, [place] 04/30/23 [phone]

## 2023-04-30 NOTE — Discharge Instructions (Addendum)
 You have sinus infection. A sinus infection, also called sinusitis, is inflammation of your sinuses. Take medications as prescribed. Make sure you finish all the antibiotics even if you start to feel better. You may also take tylenol and/or ibuprofen as needed for pain and/or fever. Continue to use Flonase daily. You may also continue the daily allergy medication. Stop taking the sudafed, nyquil and dayquil. Drink enough fluid to keep your urine pale yellow. Staying hydrated will also help to thin your mucus. Use a cool mist humidifier to keep the humidity level in your home above 50%. Inhale steam for 10-15 minutes, 3-4 times a day. You can do this in the bathroom while a hot shower is running and/or purchase over-the-counter vapor shower tablets which is great to help with nasal congestion.Try to limit your exposure to cool or dry air. Sleep with your head raised to decrease post-nasal drainage. Make sure you get enough sleep each night. Change your toothbrush after finishing the antibiotics.

## 2023-05-09 ENCOUNTER — Encounter: Payer: Self-pay | Admitting: Nurse Practitioner

## 2023-05-09 DIAGNOSIS — J309 Allergic rhinitis, unspecified: Secondary | ICD-10-CM

## 2023-05-09 DIAGNOSIS — J329 Chronic sinusitis, unspecified: Secondary | ICD-10-CM

## 2023-07-22 ENCOUNTER — Ambulatory Visit (INDEPENDENT_AMBULATORY_CARE_PROVIDER_SITE_OTHER): Admitting: Otolaryngology

## 2023-07-22 ENCOUNTER — Encounter (INDEPENDENT_AMBULATORY_CARE_PROVIDER_SITE_OTHER): Payer: Self-pay | Admitting: Otolaryngology

## 2023-07-22 VITALS — BP 127/84 | HR 79 | Ht 64.0 in | Wt 170.0 lb

## 2023-07-22 DIAGNOSIS — R0981 Nasal congestion: Secondary | ICD-10-CM

## 2023-07-22 DIAGNOSIS — H6991 Unspecified Eustachian tube disorder, right ear: Secondary | ICD-10-CM

## 2023-07-22 DIAGNOSIS — J343 Hypertrophy of nasal turbinates: Secondary | ICD-10-CM

## 2023-07-22 DIAGNOSIS — J0181 Other acute recurrent sinusitis: Secondary | ICD-10-CM | POA: Diagnosis not present

## 2023-07-22 DIAGNOSIS — H9201 Otalgia, right ear: Secondary | ICD-10-CM | POA: Diagnosis not present

## 2023-07-22 DIAGNOSIS — J342 Deviated nasal septum: Secondary | ICD-10-CM

## 2023-07-22 MED ORDER — AMOXICILLIN-POT CLAVULANATE 875-125 MG PO TABS: 1.0000 | ORAL_TABLET | Freq: Two times a day (BID) | ORAL | 0 refills | Status: DC

## 2023-07-22 MED ORDER — AZELASTINE HCL 0.1 % NA SOLN: 2.0000 | Freq: Two times a day (BID) | NASAL | 12 refills | Status: DC

## 2023-07-22 NOTE — Patient Instructions (Signed)
 Use two sprays of flonase  in each nostril twice per day right after, use astelin spray two sprays each nostril twice per day   Andres Bangs Med Nasal Saline Rinse - buy at drug store; - start nasal saline rinses with NeilMed Bottle available over the counter    Nasal Saline Irrigation instructions: If you choose to make your own salt water solution, You will need: Salt (kosher, canning, or pickling salt) Baking soda Nasal irrigation bottle (i.e. Andres Bangs Med Sinus Rinse) Measuring spoon ( teaspoon) Distilled / boiled water   Mix solution Mix 1 teaspoon of salt, 1/2 teaspoon of baking soda and 1 cup of water into irrigation bottle ** May use saline packet instead of homemade recipe for this step if you prefer If medicine was prescribed to be mixed with solution, place this into bottle Examples 2 inches of 2% mupirocin  ointment Budesonide solution Position your head: Lean over sink (about 45 degrees) Rotate head (about 45 degrees) so that one nostril is above the other Irrigate Insert tip of irrigation bottle into upper nostril so it forms a comfortable seal Irrigate while breathing through your mouth May remove the straw from the bottle in order to irrigate the entire solution (important if medicine was added) Exhale through nose when finished and blow nose as necessary  Repeat on opposite side with other 1/2 of solution (120 mL) or remake solution if all 240 mL was used on first side Wash irrigation bottle regularly, replace every 3 months

## 2023-07-22 NOTE — Progress Notes (Signed)
 Dear Dr. Ethelyn Herbert, Here is my assessment for our mutual patient, Carla Wang. Thank you for allowing me the opportunity to care for your patient. Please do not hesitate to contact me should you have any other questions. Sincerely, Dr. Milon Aloe  Otolaryngology Clinic Note  HISTORY: Carla Wang is a 23 y.o. female kindly referred by Dr. Ethelyn Herbert for evaluation of ear and nasal complaints  Initial visit (07/2023): Patient reports: over past few years, she has been having recurrent sinus and ear discomfort/pain. During infection, she reports that her typical symptoms include ear pain, and right nasal pressure (along bridge, not frontal, max), nasal congestion, and cough. She also has discolored drainage from nose. No hyposmia. She reports that the ear symptoms typically accompany nasal symptoms, and including pain, muffled hearing (sometimes), and intermittent itching. No issues on left. Last infection was in February. 2025. She gets antibiotics for this and her symptoms resolve. Between episodes, she does not have any ear or nasal symptoms. Worse when weather changes. Currently not having issues. Patient currently denies: ear pain, fullness, drainage, tinnitus, hearing loss, nasal congestion, facial pressure, hyposmia Patient also denies barotrauma, vestibular suppressant use, ototoxic medication use Prior ear surgery: no  She does use claritin , and flonase  (once a day) and singulair  for allergies. She has not used nasal rinses.  She has prior tried sudafed and dayquil --- stop once she feels better. Never had allergy testing. Has not tried nasal rinses. If she does not take her allergy medication, she has recurrence of the symptoms.   No Prior CT  GLP-1: no AP/AC: no  Tobacco: no  PMHx: Denies  RADIOGRAPHIC EVALUATION AND INDEPENDENT REVIEW OF OTHER RECORDS: Dr. Odean Bend (02/2021) ENT notes: h/o allergies, using flonsae intermittently; on singulair  and allegra ; left nasal congestion; Dx:  left OM and AR with ETD; augmentin , flonase  02/18/2023, 04/21/2023, and 04/30/2023 UC notes reviewed in EPIC: noted viral URI and recurrent sinusitis. Rx with augmentin  in Dec, right ear pain/URI on 04/21/23 Rx with supportive care, on 04/30/2023 treated with Amox and pred CMP 03/14/2023: Bun/Cr 10/0.64 CBC w/diff 11/29/2020: Eos 100 Past Medical History:  Diagnosis Date   Allergy    ETD (Eustachian tube dysfunction), bilateral 11/29/2020   eval by ENT 2021   Past Surgical History:  Procedure Laterality Date   HAND SURGERY     INGUINAL HERNIA REPAIR     KNEE ARTHROSCOPY WITH MEDIAL MENISECTOMY Right 08/08/2021   Procedure: RIGHT KNEE ARTHROSCOPY WITH PARTIAL MEDIAL MENISECTOMY;  Surgeon: Wes Hamman, MD;  Location: [facility];  Service: Orthopedics;  Laterality: Right;   Family History  Problem Relation Age of Onset   Diabetes Mother    Healthy Brother    Diabetes Maternal Aunt    Diabetes Maternal Grandmother    Prostate cancer Paternal Grandfather    Heart disease Neg Hx    Hyperlipidemia Neg Hx    Hypertension Neg Hx    Social History   Tobacco Use   Smoking status: Never   Smokeless tobacco: Never  Substance Use Topics   Alcohol use: Yes    Comment: occas   No Known Allergies Current Outpatient Medications  Medication Sig Dispense Refill   amoxicillin -clavulanate (AUGMENTIN ) 875-125 MG tablet Take 1 tablet by mouth 2 (two) times daily. 20 tablet 0   azelastine (ASTELIN) 0.1 % nasal spray Place 2 sprays into both nostrils 2 (two) times daily. Use in each nostril as directed 30 mL 12   fluticasone  (FLONASE ) 50 MCG/ACT nasal spray Place  into both nostrils daily.     LO LOESTRIN FE 1 MG-10 MCG / 10 MCG tablet Take 1 tablet by mouth daily.     loratadine  (CLARITIN ) 10 MG tablet Take 10 mg by mouth daily.     montelukast  (SINGULAIR ) 10 MG tablet Take 10 mg by mouth daily.     brompheniramine-pseudoephedrine -DM 30-2-10 MG/5ML syrup Take 10 mLs by mouth every 6  (six) hours as needed (cough/congestion). (Patient not taking: Reported on 07/22/2023) 120 mL 0   methylPREDNISolone  (MEDROL  DOSEPAK) 4 MG TBPK tablet Take as directed until finished (Patient not taking: Reported on 07/22/2023) 21 tablet 0   No current facility-administered medications for this visit.   BP 127/84 (BP Location: Left Arm, Patient Position: Sitting, Cuff Size: Normal)   Pulse 79   Ht 5\' 4"  (1.626 m)   Wt 170 lb (77.1 kg)   SpO2 95%   BMI 29.18 kg/m   PHYSICAL EXAM:  BP 127/84 (BP Location: Left Arm, Patient Position: Sitting, Cuff Size: Normal)   Pulse 79   Ht 5\' 4"  (1.626 m)   Wt 170 lb (77.1 kg)   SpO2 95%   BMI 29.18 kg/m    Salient findings:  CN II-XII intact Bilateral EAC clear and TM intact with well pneumatized middle ear spaces; mild eczematoid change b/l Nose: Anterior rhinoscopy reveals septum mild dev right, left > right inferior turbinate hypertrophy.  Nasal endoscopy was indicated to better evaluate the nose and paranasal sinuses, given the patient's history and exam findings, and is detailed below. No lesions of oral cavity/oropharynx; dentition fair No obviously palpable neck masses/lymphadenopathy/thyromegaly No respiratory distress or stridor   PROCEDURE:  Prior to initiating any procedures, risks/benefits/alternatives were explained to the patient and verbal consent obtained. Diagnostic Nasal Endoscopy Pre-procedure diagnosis: Concern for recurrent sinusitis, nasal congestion Post-procedure diagnosis: same Indication: See pre-procedure diagnosis and physical exam above Complications: None apparent EBL: 0 mL Anesthesia: Lidocaine  4% and topical decongestant was topically sprayed in each nasal cavity  Description of Procedure:  Patient was identified. A rigid 30 degree endoscope was utilized to evaluate the sinonasal cavities, mucosa, sinus ostia and turbinates and septum.  Overall, signs of mucosal inflammation are mild - global mucosal edema which  is mild; right mild septal deviation; modest mucoid secretions around MM and SE on left and right. No mucopurulence, polyps, or masses noted.   Right Middle meatus: clear otherwise Right SE Recess: see above Left MM: clear otherwise Left SE Recess: clear otherwise    Photodocumentation was obtained.  CPT CODE -- 16109 - Mod 25   ASSESSMENT:  23 y.o. with:  1. Other acute recurrent sinusitis   2. Nasal congestion   3. Hypertrophy of both inferior nasal turbinates   4. Nasal septal deviation   5. Dysfunction of right eustachian tube   6. Ear pain, right    Multiple issues today but main complaint is right sinonasal complaints with recurrent complaints. Endo shows some mucosal edema and mucoid secretions. Not having baseline obstruction.Given her symptoms, suspect ear sx related to her sinonasal sx. She has not tried maximal medical management so will try that first and if does not help, will consider CT.  PLAN: We've discussed issues and options today.  The risks, benefits and alternatives were discussed and questions answered.  She has elected to proceed with:  1) Increase flonase  to BID; start astelin BID 2) Daily sinus rinses 3) Augmentin  BID x10d 2) Follow-up in 6-8 weeks -- sooner as necessary. If persist sx,  consider CT  See below regarding exact medications prescribed this encounter including dosages and route: Meds ordered this encounter  Medications   amoxicillin -clavulanate (AUGMENTIN ) 875-125 MG tablet    Sig: Take 1 tablet by mouth 2 (two) times daily.    Dispense:  20 tablet    Refill:  0   azelastine (ASTELIN) 0.1 % nasal spray    Sig: Place 2 sprays into both nostrils 2 (two) times daily. Use in each nostril as directed    Dispense:  30 mL    Refill:  12     Thank you for allowing me the opportunity to care for your patient. Please do not hesitate to contact me should you have any other questions.  Sincerely, Milon Aloe, MD Otolaryngologist (ENT),  [facility] Phone: [phone] Fax: [phone]  MDM:  Level 4: [phone] Complexity/Problems addressed: mod - multiple complaints Data complexity: mod - independent review of notes, labs - Morbidity: mod  - Prescription Drug prescribed or managed: yes  07/22/2023, 2:08 PM

## 2023-10-29 ENCOUNTER — Encounter (INDEPENDENT_AMBULATORY_CARE_PROVIDER_SITE_OTHER): Payer: Self-pay | Admitting: Otolaryngology

## 2023-10-29 ENCOUNTER — Ambulatory Visit (INDEPENDENT_AMBULATORY_CARE_PROVIDER_SITE_OTHER): Admitting: Otolaryngology

## 2023-10-29 VITALS — BP 123/84 | HR 80 | Ht 64.0 in | Wt 178.0 lb

## 2023-10-29 DIAGNOSIS — H6991 Unspecified Eustachian tube disorder, right ear: Secondary | ICD-10-CM | POA: Diagnosis not present

## 2023-10-29 DIAGNOSIS — R0981 Nasal congestion: Secondary | ICD-10-CM | POA: Diagnosis not present

## 2023-10-29 DIAGNOSIS — J343 Hypertrophy of nasal turbinates: Secondary | ICD-10-CM

## 2023-10-29 DIAGNOSIS — J342 Deviated nasal septum: Secondary | ICD-10-CM

## 2023-10-29 DIAGNOSIS — J0181 Other acute recurrent sinusitis: Secondary | ICD-10-CM | POA: Diagnosis not present

## 2023-10-29 DIAGNOSIS — H608X3 Other otitis externa, bilateral: Secondary | ICD-10-CM

## 2023-10-29 MED ORDER — BETAMETHASONE DIPROPIONATE 0.05 % EX CREA: TOPICAL_CREAM | Freq: Two times a day (BID) | CUTANEOUS | 0 refills | Status: AC

## 2023-10-29 MED ORDER — FLUOCINOLONE ACETONIDE 0.01 % OT OIL: 4.0000 [drp] | TOPICAL_OIL | Freq: Two times a day (BID) | OTIC | 1 refills | Status: AC

## 2023-10-29 MED ORDER — FLUTICASONE PROPIONATE 50 MCG/ACT NA SUSP: 2.0000 | Freq: Two times a day (BID) | NASAL | 6 refills | Status: DC

## 2023-10-29 MED ORDER — AZELASTINE HCL 0.1 % NA SOLN: 2.0000 | Freq: Two times a day (BID) | NASAL | 12 refills | Status: AC

## 2023-10-29 NOTE — Patient Instructions (Addendum)
 I have ordered an imaging study for you to complete prior to your next visit. Please call [facility] Scheduling at [phone] to schedule your imaging if you have not received a call within 24 hours. If you are unable to complete your imaging study prior to your next scheduled visit please call our office to let us  know.   Use two sprays of flonase  in each nostril twice per day  right after, use astelin  spray two sprays each nostril twice per day  Aureliano Med Nasal Saline Rinse - use daily; stop rinsing 3 days before CT scan - start nasal saline rinses with NeilMed Bottle available over the counter  For ears, use dermotic  ear drops 4 drops twice per day in each ear; then, after that, use betamethasone  steroid ointment (pea sized amount) just to outside of ear canal twice daily. Use both of these for 2 weeks

## 2023-10-29 NOTE — Progress Notes (Signed)
 Dear Dr. Katheen, Here is my assessment for our mutual patient, Carla Wang. Thank you for allowing me the opportunity to care for your patient. Please do not hesitate to contact me should you have any other questions. Sincerely, Dr. Eldora Blanch  Otolaryngology Clinic Note  HISTORY: Carla Wang is a 23 y.o. female kindly referred by Dr. Katheen for evaluation of ear and nasal complaints  Initial visit (07/2023): Patient reports: over past few years, she has been having recurrent sinus and ear discomfort/pain. During infection, she reports that her typical symptoms include ear pain, and right nasal pressure (along bridge, not frontal, max), nasal congestion, and cough. She also has discolored drainage from nose. No hyposmia. She reports that the ear symptoms typically accompany nasal symptoms, and including pain, muffled hearing (sometimes), and intermittent itching. No issues on left. Last infection was in February. 2025. She gets antibiotics for this and her symptoms resolve. Between episodes, she does not have any ear or nasal symptoms. Worse when weather changes. Currently not having issues. Patient currently denies: ear pain, fullness, drainage, tinnitus, hearing loss, nasal congestion, facial pressure, hyposmia Patient also denies barotrauma, vestibular suppressant use, ototoxic medication use Prior ear surgery: no  She does use claritin , and flonase  (once a day) and singulair  for allergies. She has not used nasal rinses.  She has prior tried sudafed and dayquil --- stop once she feels better. Never had allergy testing. Has not tried nasal rinses. If she does not take her allergy medication, she has recurrence of the symptoms.   No Prior CT  --------------------------------------------------------- 10/29/2023 She reports that she is doing better - no sinus infections/pain. She does report that the right side still has some pressure (along bridge). No discolored drainage, no hyposmia. No  need for abx/steroids. Using claritin , flonase , singulair , astelin . Uses rinses.  Augmentin  helped. Ears do itch on both sides. No drainage, pain/fullness   GLP-1: no AP/AC: no  Tobacco: no  PMHx: Denies  RADIOGRAPHIC EVALUATION AND INDEPENDENT REVIEW OF OTHER RECORDS: Dr. Ethyl (02/2021) ENT notes: h/o allergies, using flonsae intermittently; on singulair  and allegra ; left nasal congestion; Dx: left OM and AR with ETD; augmentin , flonase  02/18/2023, 04/21/2023, and 04/30/2023 UC notes reviewed in EPIC: noted viral URI and recurrent sinusitis. Rx with augmentin  in Dec, right ear pain/URI on 04/21/23 Rx with supportive care, on 04/30/2023 treated with Amox and pred CMP 03/14/2023: Bun/Cr 10/0.64 CBC w/diff 11/29/2020: Eos 100 Past Medical History:  Diagnosis Date   Allergy    ETD (Eustachian tube dysfunction), bilateral 11/29/2020   eval by ENT 2021   Past Surgical History:  Procedure Laterality Date   HAND SURGERY     INGUINAL HERNIA REPAIR     KNEE ARTHROSCOPY WITH MEDIAL MENISECTOMY Right 08/08/2021   Procedure: RIGHT KNEE ARTHROSCOPY WITH PARTIAL MEDIAL MENISECTOMY;  Surgeon: Jerri Kay HERO, MD;  Location: [facility];  Service: Orthopedics;  Laterality: Right;   Family History  Problem Relation Age of Onset   Diabetes Mother    Healthy Brother    Diabetes Maternal Aunt    Diabetes Maternal Grandmother    Prostate cancer Paternal Grandfather    Heart disease Neg Hx    Hyperlipidemia Neg Hx    Hypertension Neg Hx    Social History   Tobacco Use   Smoking status: Never   Smokeless tobacco: Never  Substance Use Topics   Alcohol use: Yes    Comment: occas   No Known Allergies Current Outpatient Medications  Medication Sig Dispense  Refill   amoxicillin -clavulanate (AUGMENTIN ) 875-125 MG tablet Take 1 tablet by mouth 2 (two) times daily. 20 tablet 0   betamethasone  dipropionate 0.05 % cream Apply topically 2 (two) times daily for 14 days. Use just outside the  ear for 2 weeks 30 g 0   Fluocinolone  Acetonide (DERMOTIC ) 0.01 % OIL Place 4 drops in ear(s) in the morning and at bedtime for 14 days. 20 mL 1   LO LOESTRIN FE 1 MG-10 MCG / 10 MCG tablet Take 1 tablet by mouth daily.     loratadine  (CLARITIN ) 10 MG tablet Take 10 mg by mouth daily.     montelukast  (SINGULAIR ) 10 MG tablet Take 10 mg by mouth daily.     azelastine  (ASTELIN ) 0.1 % nasal spray Place 2 sprays into both nostrils 2 (two) times daily. Use in each nostril as directed 30 mL 12   brompheniramine-pseudoephedrine -DM 30-2-10 MG/5ML syrup Take 10 mLs by mouth every 6 (six) hours as needed (cough/congestion). (Patient not taking: Reported on 10/29/2023) 120 mL 0   fluticasone  (FLONASE ) 50 MCG/ACT nasal spray Place 2 sprays into both nostrils in the morning and at bedtime. 15 mL 6   methylPREDNISolone  (MEDROL  DOSEPAK) 4 MG TBPK tablet Take as directed until finished (Patient not taking: Reported on 10/29/2023) 21 tablet 0   No current facility-administered medications for this visit.   BP 123/84 (BP Location: Left Arm, Patient Position: Sitting, Cuff Size: Normal)   Pulse 80   Ht 5' 4 (1.626 m)   Wt 178 lb (80.7 kg)   SpO2 91%   BMI 30.55 kg/m   PHYSICAL EXAM:  BP 123/84 (BP Location: Left Arm, Patient Position: Sitting, Cuff Size: Normal)   Pulse 80   Ht 5' 4 (1.626 m)   Wt 178 lb (80.7 kg)   SpO2 91%   BMI 30.55 kg/m    Salient findings:  CN II-XII intact Bilateral EAC clear and TM intact with well pneumatized middle ear spaces; mild eczematoid change b/l Nose: Anterior rhinoscopy reveals septum mild dev right, left > right inferior turbinate hypertrophy.  Nasal endoscopy was indicated to better evaluate the nose and paranasal sinuses, given the patient's history and exam findings, and is detailed below. No lesions of oral cavity/oropharynx; dentition fair No obviously palpable neck masses/lymphadenopathy/thyromegaly No respiratory distress or stridor   PROCEDURE:  Prior  to initiating any procedures, risks/benefits/alternatives were explained to the patient and verbal consent obtained. Diagnostic Nasal Endoscopy Pre-procedure diagnosis: Concern for recurrent sinusitis, nasal congestion - assess response given persistent sx Post-procedure diagnosis: same Indication: See pre-procedure diagnosis and physical exam above Complications: None apparent EBL: 0 mL Anesthesia: Lidocaine  4% and topical decongestant was topically sprayed in each nasal cavity  Description of Procedure:  Patient was identified. A rigid 30 degree endoscope was utilized to evaluate the sinonasal cavities, mucosa, sinus ostia and turbinates and septum.  Overall, signs of mucosal inflammation are mild - global mucosal edema; right mild septal deviation; no significant mucoid secretions today. No mucopurulence, polyps, or masses noted.   Right Middle meatus: clear  Right SE Recess: clear Left MM: clear Left SE Recess: clear  Photodocumentation was obtained.  CPT CODE -- 68768 - Mod 25   ASSESSMENT:  23 y.o. with:  1. Other acute recurrent sinusitis   2. Nasal congestion   3. Hypertrophy of both inferior nasal turbinates   4. Dysfunction of right eustachian tube   5. Nasal septal deviation   6. Chronic eczematous otitis externa of both ears  Multiple issues today but main complaint is right sinonasal complaints with recurrent infections. She is on maximal medical therapy and has some improvement but continued bilateral pressure (over bridge of nose/OMC area - right>left) Endo shows some mucosal edema but no purulence. Prior abx helped. Not having baseline obstruction.Given her symptoms despite medical management, we discussed utility of CT and she agrees  PLAN: We've discussed issues and options today.  The risks, benefits and alternatives were discussed and questions answered.  She has elected to proceed with:  1) Continue flonase  to BID; start astelin  BID 2) Daily sinus  rinses 3) CT sinus 4) Start betamethasone  BID and dermotic  oil BID for 2 weeks for eczematoid OE  Follow-up in ~8 weeks with CT  See below regarding exact medications prescribed this encounter including dosages and route: Meds ordered this encounter  Medications   betamethasone  dipropionate 0.05 % cream    Sig: Apply topically 2 (two) times daily for 14 days. Use just outside the ear for 2 weeks    Dispense:  30 g    Refill:  0   fluticasone  (FLONASE ) 50 MCG/ACT nasal spray    Sig: Place 2 sprays into both nostrils in the morning and at bedtime.    Dispense:  15 mL    Refill:  6   azelastine  (ASTELIN ) 0.1 % nasal spray    Sig: Place 2 sprays into both nostrils 2 (two) times daily. Use in each nostril as directed    Dispense:  30 mL    Refill:  12   Fluocinolone  Acetonide (DERMOTIC ) 0.01 % OIL    Sig: Place 4 drops in ear(s) in the morning and at bedtime for 14 days.    Dispense:  20 mL    Refill:  1     Thank you for allowing me the opportunity to care for your patient. Please do not hesitate to contact me should you have any other questions.  Sincerely, Eldora Blanch, MD Otolaryngologist (ENT), [facility] Phone: [phone] Fax: [phone]  MDM:  Level 4: 99214 Complexity/Problems addressed: mod - multiple complaints Data complexity: low - Morbidity: mod  - Prescription Drug prescribed or managed: yes  10/29/2023, 8:55 AM

## 2023-11-17 ENCOUNTER — Ambulatory Visit
Admission: EM | Admit: 2023-11-17 | Discharge: 2023-11-17 | Disposition: A | Discharge status: Home / Self Care | Attending: Physician Assistant | Admitting: Physician Assistant

## 2023-11-17 ENCOUNTER — Ambulatory Visit (INDEPENDENT_AMBULATORY_CARE_PROVIDER_SITE_OTHER): Admitting: Radiology

## 2023-11-17 ENCOUNTER — Other Ambulatory Visit: Payer: Self-pay

## 2023-11-17 DIAGNOSIS — R0989 Other specified symptoms and signs involving the circulatory and respiratory systems: Secondary | ICD-10-CM

## 2023-11-17 DIAGNOSIS — R0689 Other abnormalities of breathing: Secondary | ICD-10-CM | POA: Diagnosis not present

## 2023-11-17 DIAGNOSIS — J029 Acute pharyngitis, unspecified: Secondary | ICD-10-CM

## 2023-11-17 LAB — POC COVID19/FLU A&B COMBO
Covid Antigen, POC: NEGATIVE
Influenza A Antigen, POC: NEGATIVE
Influenza B Antigen, POC: NEGATIVE

## 2023-11-17 LAB — POCT RAPID STREP A (OFFICE): Rapid Strep A Screen: NEGATIVE

## 2023-11-17 MED ORDER — AZITHROMYCIN 250 MG PO TABS: ORAL_TABLET | ORAL | 0 refills | Status: DC

## 2023-11-17 MED ORDER — PREDNISONE 20 MG PO TABS: 40.0000 mg | ORAL_TABLET | Freq: Every day | ORAL | 0 refills | Status: AC

## 2023-11-17 NOTE — Discharge Instructions (Signed)
 You were seen today for concerns of persistent runny nose, nasal congestion, coughing and sore throat that has been ongoing for over a week.  Your symptoms are not improving with over-the-counter medications so I think it is appropriate to start you on an antibiotic.  Your chest x-ray did not show obvious signs of pneumonia but I am still waiting on radiology report to provide the final interpretation.  If radiology's interpretation differs from mine we will give you a phone call as well as updates to your management plan. I am going to go ahead and start you on a steroid called prednisone  to be taken by mouth with breakfast to help with your breathing.  I am also sending in a prescription for an antibiotic called azithromycin  also known as a Z-Pak to further assist with pulmonary inflammation as well as provide antibiotic coverage.  If you start to develop any of the following symptoms please go to the emergency room: Fever that is not responding to Tylenol  and ibuprofen , shortness of breath or difficulty breathing, chest pain, confusion, loss of consciousness

## 2023-11-17 NOTE — ED Triage Notes (Signed)
 Pt presents with complaints of sore throat, nasal congestion, and right ear pain x approximately one week. Currently rates overall right ear pain a 6/10. OTC Alka-Seltzer + Flonase  taken with no improvement/relief. 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

## 2023-11-17 NOTE — ED Provider Notes (Signed)
 [facility]    CSN: [id number] Arrival date & time: 11/17/23  1657      History   Chief Complaint Chief Complaint  Patient presents with   Sore Throat   Otalgia    HPI Carla Wang is a 23 y.o. female.  has a past medical history of Allergy and ETD (Eustachian tube dysfunction), bilateral (11/29/2020).   HPI  Discussed the use of AI scribe software for clinical note transcription with the patient, who gave verbal consent to proceed.  The patient presents with a runny nose and ear pain.  She has been experiencing a runny nose and ear pain for the past week, accompanied by a mild cough. No fever, chills, nausea, vomiting, diarrhea, body aches, rashes, headaches, or dizziness. She has been using allergy medicine and Flonase  without relief.  Her throat feels affected. No one around her has had similar symptoms, although some clients have had a runny nose.  No history of asthma or breathing conditions.   Past Medical History:  Diagnosis Date   Allergy    ETD (Eustachian tube dysfunction), bilateral 11/29/2020   eval by ENT 2021    Patient Active Problem List   Diagnosis Date Noted   Acute non-recurrent pansinusitis 12/14/2021   Constipation 12/05/2021   Patellofemoral dysfunction of right knee 08/08/2021   Synovial plica of right knee 08/08/2021   Acute medial meniscus tear of right knee 05/30/2021   Chronic allergic rhinitis 11/29/2019   Acne vulgaris 10/21/2017   Seborrheic dermatitis 10/21/2017   Eczema 10/21/2017   Dysmenorrhea 10/20/2015   Scoliosis 09/08/2014    Past Surgical History:  Procedure Laterality Date   HAND SURGERY     INGUINAL HERNIA REPAIR     KNEE ARTHROSCOPY WITH MEDIAL MENISECTOMY Right 08/08/2021   Procedure: RIGHT KNEE ARTHROSCOPY WITH PARTIAL MEDIAL MENISECTOMY;  Surgeon: Jerri Kay HERO, MD;  Location: [facility];  Service: Orthopedics;  Laterality: Right;    OB History   No obstetric history on file.       Home Medications    Prior to Admission medications   Medication Sig Start Date End Date Taking? Authorizing Provider  azelastine  (ASTELIN ) 0.1 % nasal spray Place 2 sprays into both nostrils 2 (two) times daily. Use in each nostril as directed 10/29/23 12/28/23  Tobie Eldora NOVAK, MD  azithromycin  (ZITHROMAX ) 250 MG tablet Take 500mg  PO daily x1d and then 250mg  daily x4 days 11/17/23  Yes Daphyne Miguez E, PA-C  brompheniramine-pseudoephedrine -DM 30-2-10 MG/5ML syrup Take 10 mLs by mouth every 6 (six) hours as needed (cough/congestion). Patient not taking: Reported on 10/29/2023 04/30/23   Iola Lukes, FNP  fluticasone  (FLONASE ) 50 MCG/ACT nasal spray Place 2 sprays into both nostrils in the morning and at bedtime. 10/29/23   Patel, Kunjan B, MD  LO LOESTRIN FE 1 MG-10 MCG / 10 MCG tablet Take 1 tablet by mouth daily. 12/08/21   [provider]  loratadine  (CLARITIN ) 10 MG tablet Take 10 mg by mouth daily.    [provider]  montelukast  (SINGULAIR ) 10 MG tablet Take 10 mg by mouth daily. 07/15/23   [provider]  predniSONE  (DELTASONE ) 20 MG tablet Take 2 tablets (40 mg total) by mouth daily for 5 days. 11/17/23 11/22/23 Yes Lamount Bankson, Rocky BRAVO, PA-C    Family History Family History  Problem Relation Age of Onset   Diabetes Mother    Healthy Brother    Diabetes Maternal Aunt    Diabetes Maternal Grandmother  Prostate cancer Paternal Grandfather    Heart disease Neg Hx    Hyperlipidemia Neg Hx    Hypertension Neg Hx     Social History Social History   Tobacco Use   Smoking status: Never   Smokeless tobacco: Never  Vaping Use   Vaping status: Never Used  Substance Use Topics   Alcohol use: Yes    Comment: occas   Drug use: No     Allergies   Patient has no known allergies.   Review of Systems Review of Systems  Constitutional:  Negative for chills and fever.  HENT:  Positive for congestion, ear pain, postnasal drip, rhinorrhea and sore throat.    Respiratory:  Positive for cough. Negative for shortness of breath and wheezing.   Gastrointestinal:  Negative for diarrhea, nausea and vomiting.  Musculoskeletal:  Negative for myalgias.  Skin:  Negative for rash.  Neurological:  Negative for dizziness, light-headedness and headaches.     Physical Exam Triage Vital Signs ED Triage Vitals  Encounter Vitals Group     BP 11/17/23 1845 124/84     Girls Systolic BP Percentile --      Girls Diastolic BP Percentile --      Boys Systolic BP Percentile --      Boys Diastolic BP Percentile --      Pulse Rate 11/17/23 1845 73     Resp 11/17/23 1845 17     Temp 11/17/23 1845 98.5 F (36.9 C)     Temp Source 11/17/23 1845 Oral     SpO2 11/17/23 1845 98 %     Weight 11/17/23 1845 176 lb (79.8 kg)     Height 11/17/23 1845 5' 4 (1.626 m)     Head Circumference --      Peak Flow --      Pain Score 11/17/23 1857 6     Pain Loc --      Pain Education --      Exclude from Growth Chart --    No data found.  Updated Vital Signs BP 124/84 (BP Location: Right Arm)   Pulse 73   Temp 98.5 F (36.9 C) (Oral)   Resp 17   Ht 5' 4 (1.626 m)   Wt 176 lb (79.8 kg)   LMP 09/19/2023 (Approximate)   SpO2 98%   BMI 30.21 kg/m   Visual Acuity Right Eye Distance:   Left Eye Distance:   Bilateral Distance:    Right Eye Near:   Left Eye Near:    Bilateral Near:     Physical Exam Vitals reviewed.  Constitutional:      General: She is awake.     Appearance: Normal appearance. She is well-developed and well-groomed.  HENT:     Head: Normocephalic and atraumatic.     Right Ear: Hearing, tympanic membrane and ear canal normal.     Left Ear: Hearing, tympanic membrane and ear canal normal.     Mouth/Throat:     Lips: Pink.     Mouth: Mucous membranes are moist.     Pharynx: Oropharynx is clear. Uvula midline. Posterior oropharyngeal erythema and postnasal drip present. No pharyngeal swelling, oropharyngeal exudate or uvula swelling.      Tonsils: No tonsillar exudate or tonsillar abscesses. 0 on the right. 0 on the left.  Cardiovascular:     Rate and Rhythm: Normal rate and regular rhythm.     Pulses: Normal pulses.          Radial pulses are  2+ on the right side and 2+ on the left side.     Heart sounds: Normal heart sounds. No murmur heard.    No friction rub. No gallop.  Pulmonary:     Effort: Pulmonary effort is normal.     Breath sounds: Decreased air movement present. Examination of the right-lower field reveals decreased breath sounds. Examination of the left-lower field reveals decreased breath sounds. Decreased breath sounds present. No wheezing, rhonchi or rales.  Musculoskeletal:     Cervical back: Normal range of motion and neck supple.  Lymphadenopathy:     Head:     Right side of head: No submental, submandibular or preauricular adenopathy.     Left side of head: No submental, submandibular or preauricular adenopathy.     Cervical:     Right cervical: No superficial cervical adenopathy.    Left cervical: No superficial cervical adenopathy.     Upper Body:     Right upper body: No supraclavicular adenopathy.     Left upper body: No supraclavicular adenopathy.  Neurological:     Mental Status: She is alert.  Psychiatric:        Behavior: Behavior is cooperative.      UC Treatments / Results  Labs (all labs ordered are listed, but only abnormal results are displayed) Labs Reviewed  POC COVID19/FLU A&B COMBO  POCT RAPID STREP A (OFFICE)    EKG   Radiology DG Chest 2 View Result Date: 11/17/2023 CLINICAL DATA:  Decreased breath sounds in the lung bases. Congestion. EXAM: DG CHEST 2V COMPARISON:  03/10/2020 FINDINGS: The cardiomediastinal contours are normal. The lungs are clear. Pulmonary vasculature is normal. No consolidation, pleural effusion, or pneumothorax. Mild scoliosis, unchanged. No acute osseous abnormalities are seen. IMPRESSION: No active cardiopulmonary disease. Electronically Signed    By: Andrea Gasman M.D.   On: 11/17/2023 20:00    Procedures Procedures (including critical care time)  Medications Ordered in UC Medications - No data to display  Initial Impression / Assessment and Plan / UC Course  I have reviewed the triage vital signs and the nursing notes.  Pertinent labs & imaging results that were available during my care of the patient were reviewed by me and considered in my medical decision making (see chart for details).      Final Clinical Impressions(s) / UC Diagnoses   Final diagnoses:  Sore throat  Decreased breath sounds of both lungs  Decreased lung sounds  Symptoms of upper respiratory infection (URI)   Acute upper respiratory infection with cough and ear pain Symptoms include runny nose, ear pain, and cough persisting for one week. No fever, chills, or body aches. Negative strep, COVID and flu test. Postnasal drainage with cobblestoning. Symptoms not relieved by allergy medication or Flonase .  Physical exam is notable for decreased lung sounds at lung bases as well as decreased air movement.  Vitals are largely reassuring  - Order chest x-ray to rule out pneumonia.  Chest x-ray negative on my interpretation as well as radiology review. Given lung findings will start prednisone  40 mg p.o. daily x 5-day burst as well as Z-Pak to assist with pulmonary inflammation and assist with breathing.  Recommend continued use of OTC medications for further symptomatic relief. ED and return precautions reviewed and provided in AVS.  Follow-up as needed   Discharge Instructions      You were seen today for concerns of persistent runny nose, nasal congestion, coughing and sore throat that has been ongoing for over a  week.  Your symptoms are not improving with over-the-counter medications so I think it is appropriate to start you on an antibiotic.  Your chest x-ray did not show obvious signs of pneumonia but I am still waiting on radiology report to provide  the final interpretation.  If radiology's interpretation differs from mine we will give you a phone call as well as updates to your management plan. I am going to go ahead and start you on a steroid called prednisone  to be taken by mouth with breakfast to help with your breathing.  I am also sending in a prescription for an antibiotic called azithromycin  also known as a Z-Pak to further assist with pulmonary inflammation as well as provide antibiotic coverage.  If you start to develop any of the following symptoms please go to the emergency room: Fever that is not responding to Tylenol  and ibuprofen , shortness of breath or difficulty breathing, chest pain, confusion, loss of consciousness     ED Prescriptions     Medication Sig Dispense Auth. Provider   predniSONE  (DELTASONE ) 20 MG tablet Take 2 tablets (40 mg total) by mouth daily for 5 days. 10 tablet Zafirah Vanzee E, PA-C   azithromycin  (ZITHROMAX ) 250 MG tablet Take 500mg  PO daily x1d and then 250mg  daily x4 days 6 each Ameliana Brashear E, PA-C      PDMP not reviewed this encounter.   Madoline Bhatt E, PA-C 11/17/23 2132

## 2023-11-21 ENCOUNTER — Ambulatory Visit (HOSPITAL_COMMUNITY)
Admission: RE | Admit: 2023-11-21 | Discharge: 2023-11-21 | Disposition: A | Discharge status: Home / Self Care | Source: Ambulatory Visit | Attending: Otolaryngology | Admitting: Otolaryngology

## 2023-11-21 DIAGNOSIS — H6991 Unspecified Eustachian tube disorder, right ear: Secondary | ICD-10-CM | POA: Insufficient documentation

## 2023-11-21 DIAGNOSIS — J343 Hypertrophy of nasal turbinates: Secondary | ICD-10-CM | POA: Diagnosis present

## 2023-11-21 DIAGNOSIS — J0181 Other acute recurrent sinusitis: Secondary | ICD-10-CM | POA: Diagnosis present

## 2023-11-21 DIAGNOSIS — J342 Deviated nasal septum: Secondary | ICD-10-CM | POA: Insufficient documentation

## 2023-11-21 DIAGNOSIS — R0981 Nasal congestion: Secondary | ICD-10-CM | POA: Insufficient documentation

## 2023-12-21 ENCOUNTER — Other Ambulatory Visit (INDEPENDENT_AMBULATORY_CARE_PROVIDER_SITE_OTHER): Payer: Self-pay | Admitting: Otolaryngology

## 2024-02-02 ENCOUNTER — Ambulatory Visit (INDEPENDENT_AMBULATORY_CARE_PROVIDER_SITE_OTHER): Admitting: Otolaryngology

## 2024-02-05 ENCOUNTER — Encounter (INDEPENDENT_AMBULATORY_CARE_PROVIDER_SITE_OTHER): Payer: Self-pay | Admitting: Otolaryngology

## 2024-02-05 ENCOUNTER — Ambulatory Visit (INDEPENDENT_AMBULATORY_CARE_PROVIDER_SITE_OTHER): Admitting: Otolaryngology

## 2024-02-05 VITALS — BP 117/77 | HR 84 | Ht 64.0 in | Wt 176.0 lb

## 2024-02-05 DIAGNOSIS — J0181 Other acute recurrent sinusitis: Secondary | ICD-10-CM

## 2024-02-05 DIAGNOSIS — H60543 Acute eczematoid otitis externa, bilateral: Secondary | ICD-10-CM

## 2024-02-05 DIAGNOSIS — J342 Deviated nasal septum: Secondary | ICD-10-CM

## 2024-02-05 DIAGNOSIS — J343 Hypertrophy of nasal turbinates: Secondary | ICD-10-CM

## 2024-02-05 DIAGNOSIS — H6991 Unspecified Eustachian tube disorder, right ear: Secondary | ICD-10-CM

## 2024-02-05 DIAGNOSIS — H608X3 Other otitis externa, bilateral: Secondary | ICD-10-CM

## 2024-02-05 DIAGNOSIS — R0981 Nasal congestion: Secondary | ICD-10-CM

## 2024-02-05 NOTE — Progress Notes (Signed)
 Dear Dr. Katheen, Here is my assessment for our mutual patient, Carla Wang. Thank you for allowing me the opportunity to care for your patient. Please do not hesitate to contact me should you have any other questions. Sincerely, Dr. Eldora Blanch  Otolaryngology Clinic Note  HISTORY: Carla Wang is a 23 y.o. female kindly referred by Dr. Katheen for evaluation of ear and nasal complaints  Initial visit (07/2023): Patient reports: over past few years, she has been having recurrent sinus and ear discomfort/pain. During infection, she reports that her typical symptoms include ear pain, and right nasal pressure (along bridge, not frontal, max), nasal congestion, and cough. She also has discolored drainage from nose. No hyposmia. She reports that the ear symptoms typically accompany nasal symptoms, and including pain, muffled hearing (sometimes), and intermittent itching. No issues on left. Last infection was in February. 2025. She gets antibiotics for this and her symptoms resolve. Between episodes, she does not have any ear or nasal symptoms. Worse when weather changes. Currently not having issues. Patient currently denies: ear pain, fullness, drainage, tinnitus, hearing loss, nasal congestion, facial pressure, hyposmia Patient also denies barotrauma, vestibular suppressant use, ototoxic medication use Prior ear surgery: no  She does use claritin , and flonase  (once a day) and singulair  for allergies. She has not used nasal rinses.  She has prior tried sudafed and dayquil --- stop once she feels better. Never had allergy testing. Has not tried nasal rinses. If she does not take her allergy medication, she has recurrence of the symptoms.   No Prior CT  --------------------------------------------------------- 10/29/2023 She reports that she is doing better - no sinus infections/pain. She does report that the right side still has some pressure (along bridge). No discolored drainage, no hyposmia. No  need for abx/steroids. Using claritin , flonase , singulair , astelin . Uses rinses.  Augmentin  helped. Ears do itch on both sides. No drainage, pain/fullness --------------------------------------------------------- 02/05/2024 Doing ok mostly, but main complaint appears to be post nasal drip. Had URI and got abx in September. Using flonase , nasal rinse and claritin . Reports she gets around 4 infections per year. She did have a CT. Does have regular congestion on the right.   GLP-1: no AP/AC: no  Tobacco: no  PMHx: Denies  RADIOGRAPHIC EVALUATION AND INDEPENDENT REVIEW OF OTHER RECORDS: Dr. Ethyl (02/2021) ENT notes: h/o allergies, using flonsae intermittently; on singulair  and allegra ; left nasal congestion; Dx: left OM and AR with ETD; augmentin , flonase  02/18/2023, 04/21/2023, and 04/30/2023 UC notes reviewed in EPIC: noted viral URI and recurrent sinusitis. Rx with augmentin  in Dec, right ear pain/URI on 04/21/23 Rx with supportive care, on 04/30/2023 treated with Amox and pred 11/17/2023 UC Erin Mecum: ear pain, runny nose; Dx: URI, possible sinusitis(?); Rx: z-pak; ENT visit with me 07/22/2023: recurrent sinusitis/ Rx: augmentin  CMP 03/14/2023: Bun/Cr 10/0.64 CBC w/diff 11/29/2020: Eos 100 CT Sinus independently interpreted: paranasal sinuses generally clear with right septal deviation with large right haller cell and smaller left haller cell  Past Medical History:  Diagnosis Date   Allergy    ETD (Eustachian tube dysfunction), bilateral 11/29/2020   eval by ENT 2021   Past Surgical History:  Procedure Laterality Date   HAND SURGERY     INGUINAL HERNIA REPAIR     KNEE ARTHROSCOPY WITH MEDIAL MENISECTOMY Right 08/08/2021   Procedure: RIGHT KNEE ARTHROSCOPY WITH PARTIAL MEDIAL MENISECTOMY;  Surgeon: Jerri Kay HERO, MD;  Location: [facility];  Service: Orthopedics;  Laterality: Right;   Family History  Problem Relation Age of  Onset   Diabetes Mother    Healthy Brother     Diabetes Maternal Aunt    Diabetes Maternal Grandmother    Prostate cancer Paternal Grandfather    Heart disease Neg Hx    Hyperlipidemia Neg Hx    Hypertension Neg Hx    Social History   Tobacco Use   Smoking status: Never   Smokeless tobacco: Never  Substance Use Topics   Alcohol use: Yes    Comment: occas   No Known Allergies Current Outpatient Medications  Medication Sig Dispense Refill   azelastine  (ASTELIN ) 0.1 % nasal spray Place 2 sprays into both nostrils 2 (two) times daily. Use in each nostril as directed 30 mL 12   azithromycin  (ZITHROMAX ) 250 MG tablet Take 500mg  PO daily x1d and then 250mg  daily x4 days 6 each 0   fluticasone  (FLONASE ) 50 MCG/ACT nasal spray SHAKE LIQUID AND USE 2 SPRAYS IN EACH NOSTRIL IN THE MORNING AND AT BEDTIME 96 g 3   LO LOESTRIN FE 1 MG-10 MCG / 10 MCG tablet Take 1 tablet by mouth daily.     loratadine  (CLARITIN ) 10 MG tablet Take 10 mg by mouth daily.     montelukast  (SINGULAIR ) 10 MG tablet Take 10 mg by mouth daily.     brompheniramine-pseudoephedrine -DM 30-2-10 MG/5ML syrup Take 10 mLs by mouth every 6 (six) hours as needed (cough/congestion). (Patient not taking: Reported on 02/05/2024) 120 mL 0   No current facility-administered medications for this visit.   BP 117/77 (BP Location: Left Arm, Patient Position: Sitting, Cuff Size: Large)   Pulse 84   Ht 5' 4 (1.626 m)   Wt 176 lb (79.8 kg)   SpO2 97%   BMI 30.21 kg/m   PHYSICAL EXAM:  BP 117/77 (BP Location: Left Arm, Patient Position: Sitting, Cuff Size: Large)   Pulse 84   Ht 5' 4 (1.626 m)   Wt 176 lb (79.8 kg)   SpO2 97%   BMI 30.21 kg/m    Salient findings:  CN II-XII intact Bilateral EAC clear and TM intact with well pneumatized middle ear spaces; mild eczematoid change b/l Nose: Anterior rhinoscopy reveals septum dev right, left > right inferior turbinate hypertrophy.  No lesions of oral cavity/oropharynx  PROCEDURE (Prior, not today):  Prior to initiating any  procedures, risks/benefits/alternatives were explained to the patient and verbal consent obtained. Diagnostic Nasal Endoscopy Pre-procedure diagnosis: Concern for recurrent sinusitis, nasal congestion - assess response given persistent sx Post-procedure diagnosis: same Indication: See pre-procedure diagnosis and physical exam above Complications: None apparent EBL: 0 mL Anesthesia: Lidocaine  4% and topical decongestant was topically sprayed in each nasal cavity  Description of Procedure:  Patient was identified. A rigid 30 degree endoscope was utilized to evaluate the sinonasal cavities, mucosa, sinus ostia and turbinates and septum.  Overall, signs of mucosal inflammation are mild - global mucosal edema; right mild septal deviation; no significant mucoid secretions today. No mucopurulence, polyps, or masses noted.   Right Middle meatus: clear  Right SE Recess: clear Left MM: clear Left SE Recess: clear  Photodocumentation was obtained.  CPT CODE -- 68768 - Mod 25   ASSESSMENT:  23 y.o. with:  1. Nasal congestion   2. Other acute recurrent sinusitis   3. Hypertrophy of both inferior nasal turbinates   4. Dysfunction of right eustachian tube   5. Nasal septal deviation   6. Chronic eczematous otitis externa of both ears    Multiple issues today but main complaint is right sinonasal  complaints with recurrent infections. She is on maximal medical therapy and has some improvement but continued bilateral pressure (over bridge of nose/OMC area - right>left). Prior Endo shows some mucosal edema but no purulence.  Has had multiple infections and still with congestion so discussed septo/turbs/FESS given she has an anatomic reason/predisposition for sinus infections --- b/l haller cells  We discussed the goals of sinus surgery, and expectations for postoperative management. We discussed R/B/A including pain, infection, bleeding (~1% risk of operative visit for control), persistent symptoms,  need for revision surgery, and other risks including damage to the eye (<1%) and injury to skull base (<1%), anesthetic complications, among others.  Patient understands this  We discussed good chance of improvement (perhaps 80%) especially as prior abx helped.   She wishes to think about it. She will let us  know if she wishes to proceed. In interim: 1) Continue flonase  BID 2) Daily sinus rinses  See below regarding exact medications prescribed this encounter including dosages and route: No orders of the defined types were placed in this encounter.    Thank you for allowing me the opportunity to care for your patient. Please do not hesitate to contact me should you have any other questions.  Sincerely, Eldora Blanch, MD Otolaryngologist (ENT), [facility] Phone: [phone] Fax: [phone]  MDM:  Level 4: 99214 Complexity/Problems addressed: mod - multiple complaints Data complexity: mod - independent CT interpretation - Morbidity: low - Prescription Drug prescribed or managed: n  02/05/2024, 10:30 AM

## 2024-03-16 ENCOUNTER — Ambulatory Visit: Payer: Self-pay | Admitting: Nurse Practitioner

## 2024-03-16 ENCOUNTER — Ambulatory Visit: Payer: 59 | Admitting: Nurse Practitioner

## 2024-03-16 ENCOUNTER — Encounter: Payer: Self-pay | Admitting: Nurse Practitioner

## 2024-03-16 VITALS — BP 116/76 | HR 76 | Temp 97.7°F | Ht 64.0 in | Wt 170.8 lb

## 2024-03-16 DIAGNOSIS — Z0000 Encounter for general adult medical examination without abnormal findings: Secondary | ICD-10-CM

## 2024-03-16 DIAGNOSIS — Z23 Encounter for immunization: Secondary | ICD-10-CM | POA: Diagnosis not present

## 2024-03-16 LAB — COMPREHENSIVE METABOLIC PANEL WITH GFR
ALT: 11 U/L (ref 3–35)
AST: 16 U/L (ref 5–37)
Albumin: 4.3 g/dL (ref 3.5–5.2)
Alkaline Phosphatase: 31 U/L — ABNORMAL LOW (ref 39–117)
BUN: 8 mg/dL (ref 6–23)
CO2: 26 meq/L (ref 19–32)
Calcium: 9.4 mg/dL (ref 8.4–10.5)
Chloride: 103 meq/L (ref 96–112)
Creatinine, Ser: 0.82 mg/dL (ref 0.40–1.20)
GFR: 99.16 mL/min
Glucose, Bld: 85 mg/dL (ref 70–99)
Potassium: 3.9 meq/L (ref 3.5–5.1)
Sodium: 137 meq/L (ref 135–145)
Total Bilirubin: 0.4 mg/dL (ref 0.2–1.2)
Total Protein: 7.8 g/dL (ref 6.0–8.3)

## 2024-03-16 LAB — CBC
HCT: 40.7 % (ref 36.0–46.0)
Hemoglobin: 13.7 g/dL (ref 12.0–15.0)
MCHC: 33.6 g/dL (ref 30.0–36.0)
MCV: 85.1 fl (ref 78.0–100.0)
Platelets: 200 K/uL (ref 150.0–400.0)
RBC: 4.78 Mil/uL (ref 3.87–5.11)
RDW: 13.5 % (ref 11.5–15.5)
WBC: 5.3 K/uL (ref 4.0–10.5)

## 2024-03-16 LAB — TSH: TSH: 0.71 u[IU]/mL (ref 0.35–5.50)

## 2024-03-16 NOTE — Progress Notes (Signed)
 "  Complete physical exam  Patient: Carla Wang   DOB: Oct 02, 2000   23 y.o. Female  MRN: [id number] Visit Date: 03/16/2024  Subjective:    Chief Complaint  Patient presents with   Annual Exam    FASTING  Wants Flu vaccine    Carla Wang is a 24 y.o. female who presents today for a complete physical exam. She reports consuming a general diet. Gym/ health club routine includes cardio and mod to heavy weightlifting. She generally feels well. She reports sleeping well. She does not have additional problems to discuss today.  Vision:No Dental:Yes STD Screen:No  BP Readings from Last 3 Encounters:  03/16/24 116/76  02/05/24 117/77  11/17/23 124/84   Wt Readings from Last 3 Encounters:  03/16/24 170 lb 12.8 oz (77.5 kg)  02/05/24 176 lb (79.8 kg)  11/17/23 176 lb (79.8 kg)    Most recent fall risk assessment:    03/16/2024    9:24 AM  Fall Risk   Falls in the past year? 0  Number falls in past yr: 0  Injury with Fall? 0  Risk for fall due to : No Fall Risks  Follow up Falls evaluation completed     Depression screen:Yes - No Depression Most recent depression screenings:    03/16/2024    9:24 AM 03/14/2023    9:16 AM  PHQ 2/9 Scores  PHQ - 2 Score 2 1  PHQ- 9 Score 5 4      Data saved with a previous flowsheet row definition    HPI  No problem-specific Assessment & Plan notes found for this encounter.   Past Medical History:  Diagnosis Date   Allergy    ETD (Eustachian tube dysfunction), bilateral 11/29/2020   eval by ENT 2021   Past Surgical History:  Procedure Laterality Date   HAND SURGERY     INGUINAL HERNIA REPAIR     KNEE ARTHROSCOPY WITH MEDIAL MENISECTOMY Right 08/08/2021   Procedure: RIGHT KNEE ARTHROSCOPY WITH PARTIAL MEDIAL MENISECTOMY;  Surgeon: Jerri Kay HERO, MD;  Location: [facility];  Service: Orthopedics;  Laterality: Right;   Social History   Socioeconomic History   Marital status: Single    Spouse name: Not on  file   Number of children: Not on file   Years of education: Not on file   Highest education level: Bachelor's degree (e.g., BA, AB, BS)  Occupational History   Occupation: Consulting Civil Engineer   Occupation: gradutates in 2022    Employer: [employer]  Tobacco Use   Smoking status: Never   Smokeless tobacco: Never  Vaping Use   Vaping status: Never Used  Substance and Sexual Activity   Alcohol use: Yes    Comment: occas   Drug use: No   Sexual activity: Yes    Birth control/protection: Pill  Other Topics Concern   Not on file  Social History Narrative   S   Social Drivers of Health   Tobacco Use: Low Risk (02/05/2024)   Patient History    Smoking Tobacco Use: Never    Smokeless Tobacco Use: Never    Passive Exposure: Not on file  Financial Resource Strain: Low Risk (03/13/2024)   Overall Financial Resource Strain (CARDIA)    Difficulty of Paying Living Expenses: Not hard at all  Food Insecurity: No Food Insecurity (03/13/2024)   Epic    Worried About Radiation Protection Practitioner of Food in the Last Year: Never true    Ran Out of Food in the  Last Year: Never true  Transportation Needs: No Transportation Needs (03/13/2024)   Epic    Lack of Transportation (Medical): No    Lack of Transportation (Non-Medical): No  Physical Activity: Sufficiently Active (03/13/2024)   Exercise Vital Sign    Days of Exercise per Week: 4 days    Minutes of Exercise per Session: 60 min  Stress: Stress Concern Present (03/13/2024)   [employer] of Occupational Health - Occupational Stress Questionnaire    Feeling of Stress: Very much  Social Connections: Moderately Integrated (03/13/2024)   Social Connection and Isolation Panel    Frequency of Communication with Friends and Family: Twice a week    Frequency of Social Gatherings with Friends and Family: Once a week    Attends Religious Services: More than 4 times per year    Active Member of [employer] or Organizations: Yes    Attends Banker Meetings: More  than 4 times per year    Marital Status: Never married  Intimate Partner Violence: Unknown (06/07/2021)   Received from [facility]   HITS    Physically Hurt: Not on file    Insult or Talk Down To: Not on file    Threaten Physical Harm: Not on file    Scream or Curse: Not on file  Depression (PHQ2-9): Medium Risk (03/16/2024)   Depression (PHQ2-9)    PHQ-2 Score: 5  Alcohol Screen: Low Risk (03/13/2024)   Alcohol Screen    Last Alcohol Screening Score (AUDIT): 2  Housing: Low Risk (03/13/2024)   Epic    Unable to Pay for Housing in the Last Year: No    Number of Times Moved in the Last Year: 0    Homeless in the Last Year: No  Utilities: Not on file  Health Literacy: Not on file   Family Status  Relation Name Status   Mother  Alive   Father  Alive   Brother  Alive   Mat Aunt  Alive   MGM  Alive   MGF  Alive   PGM  Alive   PGF  Alive   Neg Hx  (Not Specified)  No partnership data on file   Family History  Problem Relation Age of Onset   Diabetes Mother    Healthy Brother    Diabetes Maternal Aunt    Diabetes Maternal Grandmother    Prostate cancer Paternal Grandfather    Heart disease Neg Hx    Hyperlipidemia Neg Hx    Hypertension Neg Hx    Allergies[1]  Patient Care Team: Nancylee Gaines, Roselie Rockford, NP as PCP - General (Internal Medicine) Netta, Isadora KATHEE RIGGERS (Dermatology) Hughie Sharper, MD as Consulting Physician (Sports Medicine) Darcel Pool, MD as Consulting Physician (Obstetrics and Gynecology) Ethyl Lonni BRAVO, MD (Inactive) as Consulting Physician (Otolaryngology)   Medications: Show/hide medication list[2]  Review of Systems  Constitutional:  Negative for activity change, appetite change and unexpected weight change.  Respiratory: Negative.    Cardiovascular: Negative.   Gastrointestinal: Negative.   Endocrine: Negative for cold intolerance and heat intolerance.  Genitourinary: Negative.   Musculoskeletal: Negative.   Skin: Negative.    Neurological: Negative.   Hematological: Negative.   Psychiatric/Behavioral:  Negative for behavioral problems, decreased concentration, dysphoric mood, hallucinations, self-injury, sleep disturbance and suicidal ideas. The patient is not nervous/anxious.         Objective:  BP 116/76 (BP Location: Right Arm, Patient Position: Sitting, Cuff Size: Normal)   Pulse 76   Temp 97.7 F (36.5 C) (  Oral)   Ht 5' 4 (1.626 m)   Wt 170 lb 12.8 oz (77.5 kg)   LMP 02/20/2024   BMI 29.32 kg/m     Physical Exam Vitals and nursing note reviewed.  Constitutional:      General: She is not in acute distress. HENT:     Right Ear: Tympanic membrane, ear canal and external ear normal.     Left Ear: Tympanic membrane, ear canal and external ear normal.     Nose: Nose normal.  Eyes:     Extraocular Movements: Extraocular movements intact.     Conjunctiva/sclera: Conjunctivae normal.     Pupils: Pupils are equal, round, and reactive to light.  Neck:     Thyroid: No thyroid mass, thyromegaly or thyroid tenderness.  Cardiovascular:     Rate and Rhythm: Normal rate and regular rhythm.     Pulses: Normal pulses.     Heart sounds: Normal heart sounds.  Pulmonary:     Effort: Pulmonary effort is normal.     Breath sounds: Normal breath sounds.  Abdominal:     General: Bowel sounds are normal.     Palpations: Abdomen is soft.  Musculoskeletal:        General: Normal range of motion.     Cervical back: Normal range of motion and neck supple.     Right lower leg: No edema.     Left lower leg: No edema.  Lymphadenopathy:     Cervical: No cervical adenopathy.  Skin:    General: Skin is warm and dry.  Neurological:     Mental Status: She is alert and oriented to person, place, and time.     Cranial Nerves: No cranial nerve deficit.  Psychiatric:        Mood and Affect: Mood normal.        Behavior: Behavior normal.        Thought Content: Thought content normal.      No results found for  any visits on 03/16/24.    Assessment & Plan:    Routine Health Maintenance and Physical Exam  Immunization History  Administered Date(s) Administered   DTaP 08/10/1998, 10/10/1998, 12/12/1998, 09/13/1999   Dtap, Unspecified 06/09/2002   HIB (PRP-OMP) 10/10/1998, 12/12/1998   HIB, Unspecified 08/10/1998, 10/10/1998, 12/12/1998, 09/13/1999   HPV 9-valent 09/27/2011, 11/29/2011, 04/01/2012   HPV Quadrivalent 09/27/2011, 11/29/2011, 04/01/2012   Hep A, Unspecified 08/13/2005   Hep B, Unspecified 07/07/1998, 12/12/1998, 03/21/1999   Hepatitis A, Ped/Adol-2 Dose 07/19/2009   IPV 08/10/1998, 10/10/1998, 03/21/1999   Influenza, Seasonal, Injecte, Preservative Fre 03/14/2023, 03/16/2024   Influenza,Quad,Nasal, Live 11/29/2011   Influenza,inj,Quad PF,6+ Mos 12/06/2015, 12/01/2018, 11/29/2019, 11/29/2020, 12/05/2021   Influenza-Unspecified 12/05/2021   MMR 06/19/1999, 06/09/2002   Meningococcal Conjugate 07/19/2009, 08/24/2014   PFIZER Comirnaty(Gray Top)Covid-19 Tri-Sucrose Vaccine 10/03/2020   PFIZER(Purple Top)SARS-COV-2 Vaccination 09/15/2019, 10/06/2019   Pneumococcal Conjugate PCV 7 08/10/1998, 10/10/1998, 12/12/1998, 09/13/1999   Polio, Unspecified 06/09/2002   Tdap 07/19/2009, 11/29/2019   Varicella 06/19/1999, 08/14/2005   Health Maintenance  Topic Date Due   COVID-19 Vaccine (4 - 2025-26 season) 11/01/2024 (Originally 11/03/2023)   Cervical Cancer Screening (Pap smear)  10/21/2025   DTaP/Tdap/Td (8 - Td or Tdap) 11/28/2029   Influenza Vaccine  Completed   HPV VACCINES  Completed   Hepatitis C Screening  Completed   HIV Screening  Completed   Pneumococcal Vaccine  Aged Out   Meningococcal B Vaccine  Aged Out   Discussed health benefits of physical activity, and encouraged  her to engage in regular exercise appropriate for her age and condition.  Problem List Items Addressed This Visit   None Visit Diagnoses       Preventative health care    -  Primary   Relevant Orders    Comprehensive metabolic panel with GFR   CBC   TSH     Need for influenza vaccination       Relevant Orders   Flu vaccine trivalent PF, 6mos and older(Flulaval,Afluria,Fluarix,Fluzone) (Completed)      Return in about 1 year (around 03/16/2025) for CPE (fasting).     Roselie Mood, NP    [1] No Known Allergies [2]  Outpatient Medications Prior to Visit  Medication Sig   azelastine  (ASTELIN ) 0.1 % nasal spray Place 2 sprays into both nostrils 2 (two) times daily. Use in each nostril as directed   fluticasone  (FLONASE ) 50 MCG/ACT nasal spray SHAKE LIQUID AND USE 2 SPRAYS IN EACH NOSTRIL IN THE MORNING AND AT BEDTIME   LO LOESTRIN FE 1 MG-10 MCG / 10 MCG tablet Take 1 tablet by mouth daily.   loratadine  (CLARITIN ) 10 MG tablet Take 10 mg by mouth daily.   montelukast  (SINGULAIR ) 10 MG tablet Take 10 mg by mouth daily.   brompheniramine-pseudoephedrine -DM 30-2-10 MG/5ML syrup Take 10 mLs by mouth every 6 (six) hours as needed (cough/congestion). (Patient not taking: Reported on 03/16/2024)   [DISCONTINUED] azithromycin  (ZITHROMAX ) 250 MG tablet Take 500mg  PO daily x1d and then 250mg  daily x4 days (Patient not taking: Reported on 03/16/2024)   No facility-administered medications prior to visit.   "

## 2024-03-16 NOTE — Patient Instructions (Signed)
 Go to lab Maintain Heart healthy diet and daily exercise.

## 2025-03-17 ENCOUNTER — Encounter: Admitting: Nurse Practitioner
